# Patient Record
Sex: Male | Born: 1975 | Race: White | Hispanic: No | Marital: Married | Smoking: Never smoker
Health system: Southern US, Community
[De-identification: ages and names within clinical notes are randomized; demographics above are authoritative.]

## PROBLEM LIST (undated history)

## (undated) DIAGNOSIS — G549 Nerve root and plexus disorder, unspecified: Secondary | ICD-10-CM

## (undated) DIAGNOSIS — M5126 Other intervertebral disc displacement, lumbar region: Secondary | ICD-10-CM

## (undated) HISTORY — PX: MICRODISCECTOMY LUMBAR: SUR864

## (undated) HISTORY — PX: WISDOM TOOTH EXTRACTION: SHX21

## (undated) HISTORY — PX: VASECTOMY: SHX75

---

## 2012-08-14 ENCOUNTER — Ambulatory Visit
Admission: RE | Admit: 2012-08-14 | Discharge: 2012-08-14 | Disposition: A | Discharge status: Home / Self Care | Payer: 59 | Source: Ambulatory Visit | Attending: Orthopaedic Surgery | Admitting: Orthopaedic Surgery

## 2012-08-14 ENCOUNTER — Other Ambulatory Visit: Payer: Self-pay | Admitting: Orthopaedic Surgery

## 2012-08-14 DIAGNOSIS — M545 Low back pain, unspecified: Secondary | ICD-10-CM

## 2012-08-14 DIAGNOSIS — R200 Anesthesia of skin: Secondary | ICD-10-CM

## 2012-08-14 DIAGNOSIS — M5410 Radiculopathy, site unspecified: Secondary | ICD-10-CM

## 2012-08-15 ENCOUNTER — Other Ambulatory Visit (HOSPITAL_COMMUNITY): Payer: Self-pay | Admitting: Orthopaedic Surgery

## 2012-08-15 ENCOUNTER — Encounter (HOSPITAL_COMMUNITY): Payer: Self-pay | Admitting: Pharmacy Technician

## 2012-08-15 ENCOUNTER — Other Ambulatory Visit: Payer: Self-pay | Admitting: Orthopaedic Surgery

## 2012-08-16 ENCOUNTER — Encounter (HOSPITAL_COMMUNITY)
Admission: RE | Admit: 2012-08-16 | Discharge: 2012-08-16 | Disposition: A | Discharge status: Home / Self Care | Payer: 59 | Source: Ambulatory Visit | Attending: Orthopaedic Surgery | Admitting: Orthopaedic Surgery

## 2012-08-16 ENCOUNTER — Encounter (HOSPITAL_COMMUNITY): Payer: Self-pay

## 2012-08-16 ENCOUNTER — Other Ambulatory Visit: Payer: Self-pay

## 2012-08-16 ENCOUNTER — Encounter (HOSPITAL_COMMUNITY): Payer: Self-pay | Admitting: Pharmacy Technician

## 2012-08-16 LAB — COMPREHENSIVE METABOLIC PANEL
ALT: 32 U/L (ref 0–53)
AST: 25 U/L (ref 0–37)
Albumin: 3.9 g/dL (ref 3.5–5.2)
CO2: 30 mEq/L (ref 19–32)
Chloride: 103 mEq/L (ref 96–112)
GFR calc non Af Amer: >90 mL/min (ref >90)
Potassium: 4.7 mEq/L (ref 3.5–5.1)
Sodium: 141 mEq/L (ref 135–145)
Total Bilirubin: 0.3 mg/dL (ref 0.3–1.2)

## 2012-08-16 LAB — CBC
Platelets: 264 10*3/uL (ref 150–400)
RBC: 4.99 MIL/uL (ref 4.22–5.81)
RDW: 12.2 % (ref 11.5–15.5)
WBC: 7.3 10*3/uL (ref 4.0–10.5)

## 2012-08-16 LAB — SURGICAL PCR SCREEN
MRSA, PCR: NEGATIVE
Staphylococcus aureus: NEGATIVE

## 2012-08-16 NOTE — Pre-Procedure Instructions (Addendum)
[place]aly Kroeker  08/16/2012    Your procedure is scheduled on:  08/18/12  Report to [facility] at 830AM.  Call this number if you have problems the morning of surgery: [phone]   Remember:   Do not eat food or drink liquids after midnight.   Take these medicines the morning of surgery with A SIP OF WATER:none STOP ibuprofen now   Do not wear jewelry, make-up or nail polish.  Do not wear lotions, powders, or perfumes. You may wear deodorant.  Do not shave 48 hours prior to surgery. Men may shave face and neck.  Do not bring valuables to the hospital.  Contacts, dentures or bridgework may not be worn into surgery.  Leave suitcase in the car. After surgery it may be brought to your room.  For patients admitted to the hospital, checkout time is 11:00 AM the day of  discharge.   Patients discharged the day of surgery will not be allowed to drive  home.  Name and phone number of your driver:   Special Instructions: Shower using CHG 2 nights before surgery and the night before surgery.  If you shower the day of surgery use CHG.  Use special wash - you have one bottle of CHG for all showers.  You should use approximately 1/3 of the bottle for each shower.   Please read over the following fact sheets that you were given: Pain Booklet, Coughing and Deep Breathing and MRSA Information

## 2012-08-17 DIAGNOSIS — M5126 Other intervertebral disc displacement, lumbar region: Secondary | ICD-10-CM | POA: Diagnosis present

## 2012-08-17 MED ORDER — CEFAZOLIN SODIUM-DEXTROSE 2-3 GM-% IV SOLR
2.0000 g | INTRAVENOUS | Status: AC
  Administered 2012-08-18: 2 g via INTRAVENOUS
  Filled 2012-08-17: qty 50

## 2012-08-17 NOTE — H&P (Signed)
Douglas Gardner is an 37 y.o. male.   Chief Complaint: back pain and right leg pain HPI:  problem with his back off and on for greater than 5 years.  In the past, he had some problems with back pain and some back spasms and now has developed radicular symptoms in his right buttocks, right leg, down to the plantar surface of his right foot, particularly with sitting, with associated lateral and plantar foot numbness.  He is better standing.  If he sits for a long time, drives a car, he has increased symptoms.  When he squats or lifts, he has had increased pain.  Denies any associated bowel or bladder symptoms.  He has taken some ibuprofen, which has really not helped. Due to his increasing symptoms and failure of response to Medrol Dosepak, MRI scan was ordered at his request which shows a large disk herniation at L5-S1 with free fragment caudally migrated, paramedian.  It occupies 80% of the canal with nerve root compression severe and lateral recess stenosis.     History reviewed. No pertinent past medical history.  History reviewed. No pertinent past surgical history.  History reviewed. No pertinent family history. Social History:  reports that he has never smoked. He does not have any smokeless tobacco history on file. He reports that he drinks about 8.4 ounces of alcohol per week. He reports that he does not use illicit drugs.  Allergies: No Known Allergies  Medications Prior to Admission  Medication Sig Dispense Refill  . cyclobenzaprine (FLEXERIL) 10 MG tablet Take 10 mg by mouth 3 (three) times daily as needed for muscle spasms.      Marland Kitchen ibuprofen (ADVIL,MOTRIN) 200 MG tablet Take 200 mg by mouth every 6 (six) hours as needed for pain.      . predniSONE (STERAPRED UNI-PAK) 5 MG TABS Take by mouth as directed.         Results for orders placed during the hospital encounter of 08/16/12 (from the past 48 hour(s))  CBC     Status: Abnormal   Collection Time    08/16/12  3:17 PM      Result  Value Range   WBC 7.3  4.0 - 10.5 K/uL   RBC 4.99  4.22 - 5.81 MIL/uL   Hemoglobin 15.5  13.0 - 17.0 g/dL   HCT 16.1  09.6 - 04.5 %   MCV 85.0  78.0 - 100.0 fL   MCH 31.1  26.0 - 34.0 pg   MCHC 36.6 (*) 30.0 - 36.0 g/dL   RDW 40.9  81.1 - 91.4 %   Platelets 264  150 - 400 K/uL  COMPREHENSIVE METABOLIC PANEL     Status: None   Collection Time    08/16/12  3:17 PM      Result Value Range   Sodium 141  135 - 145 mEq/L   Potassium 4.7  3.5 - 5.1 mEq/L   Comment: HEMOLYSIS AT THIS LEVEL MAY AFFECT RESULT   Chloride 103  96 - 112 mEq/L   CO2 30  19 - 32 mEq/L   Glucose, Bld 90  70 - 99 mg/dL   BUN 17  6 - 23 mg/dL   Creatinine, Ser 7.82  0.50 - 1.35 mg/dL   Calcium 9.4  8.4 - 95.6 mg/dL   Total Protein 7.6  6.0 - 8.3 g/dL   Albumin 3.9  3.5 - 5.2 g/dL   AST 25  0 - 37 U/L   ALT 32  0 - 53  U/L   Alkaline Phosphatase 61  39 - 117 U/L   Total Bilirubin 0.3  0.3 - 1.2 mg/dL   GFR calc non Af Amer >90  >90 mL/min   GFR calc Af Amer >90  >90 mL/min   Comment:            The eGFR has been calculated     using the CKD EPI equation.     This calculation has not been     validated in all clinical     situations.     eGFR's persistently     <90 mL/min signify     possible Chronic Kidney Disease.  SURGICAL PCR SCREEN     Status: None   Collection Time    08/16/12  3:18 PM      Result Value Range   MRSA, PCR NEGATIVE  NEGATIVE   Staphylococcus aureus NEGATIVE  NEGATIVE   Comment:            The Xpert SA Assay (FDA     approved for NASAL specimens     in patients over 73 years of age),     is one component of     a comprehensive surveillance     program.  Test performance has     been validated by [employer] for patients greater     than or equal to 72 year old.     It is not intended     to diagnose infection nor to     guide or monitor treatment.   No results found.  Review of Systems  Constitutional: Negative.   HENT: Negative.   Eyes: Negative.   Respiratory:  Negative.   Cardiovascular: Negative.   Gastrointestinal: Negative.   Genitourinary: Negative.   Musculoskeletal:       Leg pain and weakness  Skin: Negative.   Neurological: Negative.   Endo/Heme/Allergies: Negative.   Psychiatric/Behavioral: Negative.     Blood pressure 120/81, temperature 98.5 F (36.9 C), temperature source Oral, resp. rate 20, SpO2 95.00%. Physical Exam  Constitutional: He is oriented to person, place, and time. He appears well-developed and well-nourished.  HENT:  Head: Normocephalic and atraumatic.  Eyes: EOM are normal. Pupils are equal, round, and reactive to light.  Neck: Normal range of motion. Neck supple.  Cardiovascular: Normal rate.   Respiratory: Effort normal.  GI: Soft.  Musculoskeletal:  + SLR right leg.  Cannot heel and toe walk  Neurological: He is alert and oriented to person, place, and time.  Skin: Skin is warm and dry.  Psychiatric: He has a normal mood and affect.     Assessment/Plan HNP right L5-S1 with free fragment  PLAN:  Microdiscectomy right L5-S1 with excision of free fragment  Douglas Gardner C 08/18/2012, 7:20 AM

## 2012-08-18 ENCOUNTER — Ambulatory Visit (HOSPITAL_COMMUNITY): Payer: 59 | Admitting: Anesthesiology

## 2012-08-18 ENCOUNTER — Observation Stay (HOSPITAL_COMMUNITY)
Admission: RE | Admit: 2012-08-18 | Discharge: 2012-08-19 | Disposition: A | Discharge status: Home / Self Care | Payer: 59 | Source: Ambulatory Visit | Attending: Orthopaedic Surgery | Admitting: Orthopaedic Surgery

## 2012-08-18 ENCOUNTER — Encounter (HOSPITAL_COMMUNITY)
Admission: RE | Disposition: A | Discharge status: Home / Self Care | Payer: Self-pay | Source: Ambulatory Visit | Attending: Orthopaedic Surgery

## 2012-08-18 ENCOUNTER — Encounter (HOSPITAL_COMMUNITY): Payer: Self-pay | Admitting: *Deleted

## 2012-08-18 ENCOUNTER — Ambulatory Visit (HOSPITAL_COMMUNITY): Payer: 59

## 2012-08-18 ENCOUNTER — Encounter (HOSPITAL_COMMUNITY): Payer: Self-pay | Admitting: Anesthesiology

## 2012-08-18 DIAGNOSIS — M5126 Other intervertebral disc displacement, lumbar region: Principal | ICD-10-CM | POA: Diagnosis present

## 2012-08-18 DIAGNOSIS — Z01812 Encounter for preprocedural laboratory examination: Secondary | ICD-10-CM | POA: Insufficient documentation

## 2012-08-18 DIAGNOSIS — M5116 Intervertebral disc disorders with radiculopathy, lumbar region: Secondary | ICD-10-CM

## 2012-08-18 HISTORY — DX: Nerve root and plexus disorder, unspecified: G54.9

## 2012-08-18 HISTORY — PX: LUMBAR LAMINECTOMY: SHX95

## 2012-08-18 SURGERY — MICRODISCECTOMY LUMBAR LAMINECTOMY
Anesthesia: General | Site: Back | Wound class: Clean

## 2012-08-18 MED ORDER — OXYCODONE HCL 5 MG PO TABS
5.0000 mg | ORAL_TABLET | Freq: Once | ORAL | Status: AC | PRN
  Administered 2012-08-18: 5 mg via ORAL

## 2012-08-18 MED ORDER — HYDROMORPHONE HCL PF 1 MG/ML IJ SOLN: 0.5000 mg | INTRAMUSCULAR | Status: DC | PRN

## 2012-08-18 MED ORDER — ARTIFICIAL TEARS OP OINT
TOPICAL_OINTMENT | OPHTHALMIC | Status: DC | PRN
  Administered 2012-08-18: 1 via OPHTHALMIC

## 2012-08-18 MED ORDER — FENTANYL CITRATE 0.05 MG/ML IJ SOLN
INTRAMUSCULAR | Status: DC | PRN
  Administered 2012-08-18 (×3): 50 ug via INTRAVENOUS
  Administered 2012-08-18: 100 ug via INTRAVENOUS
  Administered 2012-08-18: 50 ug via INTRAVENOUS

## 2012-08-18 MED ORDER — ACETAMINOPHEN 650 MG RE SUPP: 650.0000 mg | RECTAL | Status: DC | PRN

## 2012-08-18 MED ORDER — HYDROMORPHONE HCL PF 1 MG/ML IJ SOLN: 0.2500 mg | INTRAMUSCULAR | Status: DC | PRN

## 2012-08-18 MED ORDER — HYDROMORPHONE HCL PF 1 MG/ML IJ SOLN
INTRAMUSCULAR | Status: AC
  Filled 2012-08-18: qty 1

## 2012-08-18 MED ORDER — NEOSTIGMINE METHYLSULFATE 1 MG/ML IJ SOLN
INTRAMUSCULAR | Status: DC | PRN
  Administered 2012-08-18: 3 mg via INTRAVENOUS

## 2012-08-18 MED ORDER — METHOCARBAMOL 500 MG PO TABS
500.0000 mg | ORAL_TABLET | Freq: Four times a day (QID) | ORAL | Status: DC | PRN
  Administered 2012-08-19: 500 mg via ORAL
  Filled 2012-08-18: qty 1

## 2012-08-18 MED ORDER — CYCLOBENZAPRINE HCL 10 MG PO TABS: 10.0000 mg | ORAL_TABLET | Freq: Three times a day (TID) | ORAL | Status: DC | PRN

## 2012-08-18 MED ORDER — SODIUM CHLORIDE 0.9 % IJ SOLN: 3.0000 mL | INTRAMUSCULAR | Status: DC | PRN

## 2012-08-18 MED ORDER — LACTATED RINGERS IV SOLN
INTRAVENOUS | Status: DC | PRN
  Administered 2012-08-18 (×2): via INTRAVENOUS

## 2012-08-18 MED ORDER — POTASSIUM CHLORIDE IN NACL 20-0.45 MEQ/L-% IV SOLN
INTRAVENOUS | Status: DC
  Filled 2012-08-18 (×4): qty 1000

## 2012-08-18 MED ORDER — ONDANSETRON HCL 4 MG/2ML IJ SOLN: 4.0000 mg | INTRAMUSCULAR | Status: DC | PRN

## 2012-08-18 MED ORDER — PROMETHAZINE HCL 25 MG/ML IJ SOLN: 6.2500 mg | INTRAMUSCULAR | Status: DC | PRN

## 2012-08-18 MED ORDER — OXYCODONE-ACETAMINOPHEN 5-325 MG PO TABS: 2.0000 | ORAL_TABLET | ORAL | Status: DC | PRN

## 2012-08-18 MED ORDER — ONDANSETRON HCL 4 MG/2ML IJ SOLN
INTRAMUSCULAR | Status: DC | PRN
  Administered 2012-08-18: 4 mg via INTRAVENOUS

## 2012-08-18 MED ORDER — OXYCODONE-ACETAMINOPHEN 5-325 MG PO TABS: 1.0000 | ORAL_TABLET | ORAL | Status: DC | PRN

## 2012-08-18 MED ORDER — SODIUM CHLORIDE 0.9 % IV SOLN: INTRAVENOUS | Status: DC

## 2012-08-18 MED ORDER — ALUM & MAG HYDROXIDE-SIMETH 200-200-20 MG/5ML PO SUSP: 30.0000 mL | Freq: Four times a day (QID) | ORAL | Status: DC | PRN

## 2012-08-18 MED ORDER — ACETAMINOPHEN 325 MG PO TABS
650.0000 mg | ORAL_TABLET | ORAL | Status: DC | PRN
  Administered 2012-08-19: 650 mg via ORAL
  Filled 2012-08-18: qty 2

## 2012-08-18 MED ORDER — IBUPROFEN 600 MG PO TABS
600.0000 mg | ORAL_TABLET | Freq: Three times a day (TID) | ORAL | Status: DC
  Administered 2012-08-18 (×3): 600 mg via ORAL
  Filled 2012-08-18 (×7): qty 1

## 2012-08-18 MED ORDER — PROPOFOL 10 MG/ML IV BOLUS
INTRAVENOUS | Status: DC | PRN
  Administered 2012-08-18: 200 mg via INTRAVENOUS

## 2012-08-18 MED ORDER — SODIUM CHLORIDE 0.9 % IV SOLN: 250.0000 mL | INTRAVENOUS | Status: DC

## 2012-08-18 MED ORDER — LIDOCAINE HCL (CARDIAC) 20 MG/ML IV SOLN
INTRAVENOUS | Status: DC | PRN
  Administered 2012-08-18: 100 mg via INTRAVENOUS

## 2012-08-18 MED ORDER — DEXTROSE 5 % IV SOLN: 500.0000 mg | Freq: Four times a day (QID) | INTRAVENOUS | Status: DC | PRN

## 2012-08-18 MED ORDER — PHENOL 1.4 % MT LIQD: 1.0000 | OROMUCOSAL | Status: DC | PRN

## 2012-08-18 MED ORDER — OXYCODONE HCL 5 MG/5ML PO SOLN: 5.0000 mg | Freq: Once | ORAL | Status: AC | PRN

## 2012-08-18 MED ORDER — SODIUM CHLORIDE 0.9 % IJ SOLN: 3.0000 mL | Freq: Two times a day (BID) | INTRAMUSCULAR | Status: DC

## 2012-08-18 MED ORDER — MIDAZOLAM HCL 5 MG/5ML IJ SOLN
INTRAMUSCULAR | Status: DC | PRN
  Administered 2012-08-18: 2 mg via INTRAVENOUS

## 2012-08-18 MED ORDER — OXYCODONE HCL 5 MG PO TABS
ORAL_TABLET | ORAL | Status: AC
  Filled 2012-08-18: qty 1

## 2012-08-18 MED ORDER — KETOROLAC TROMETHAMINE 30 MG/ML IJ SOLN
INTRAMUSCULAR | Status: DC | PRN
  Administered 2012-08-18 (×2): 15 mg via INTRAVENOUS

## 2012-08-18 MED ORDER — GLYCOPYRROLATE 0.2 MG/ML IJ SOLN
INTRAMUSCULAR | Status: DC | PRN
  Administered 2012-08-18: 0.4 mg via INTRAVENOUS

## 2012-08-18 MED ORDER — BUPIVACAINE HCL (PF) 0.25 % IJ SOLN
INTRAMUSCULAR | Status: AC
  Filled 2012-08-18: qty 30

## 2012-08-18 MED ORDER — ROCURONIUM BROMIDE 100 MG/10ML IV SOLN
INTRAVENOUS | Status: DC | PRN
  Administered 2012-08-18: 50 mg via INTRAVENOUS

## 2012-08-18 MED ORDER — MENTHOL 3 MG MT LOZG: 1.0000 | LOZENGE | OROMUCOSAL | Status: DC | PRN

## 2012-08-18 SURGICAL SUPPLY — 51 items
BENZOIN TINCTURE PRP APPL 2/3 (GAUZE/BANDAGES/DRESSINGS) ×2 IMPLANT
BUR ROUND FLUTED 4 SOFT TCH (BURR) IMPLANT
CLOTH BEACON ORANGE TIMEOUT ST (SAFETY) ×2 IMPLANT
CLSR STERI-STRIP ANTIMIC 1/2X4 (GAUZE/BANDAGES/DRESSINGS) ×2 IMPLANT
CORDS BIPOLAR (ELECTRODE) ×2 IMPLANT
COVER SURGICAL LIGHT HANDLE (MISCELLANEOUS) ×2 IMPLANT
DERMABOND ADVANCED (GAUZE/BANDAGES/DRESSINGS) ×1
DERMABOND ADVANCED .7 DNX12 (GAUZE/BANDAGES/DRESSINGS) ×1 IMPLANT
DRAPE MICROSCOPE LEICA (MISCELLANEOUS) ×2 IMPLANT
DRAPE PROXIMA HALF (DRAPES) ×4 IMPLANT
DRSG EMULSION OIL 3X3 NADH (GAUZE/BANDAGES/DRESSINGS) ×2 IMPLANT
DURAPREP 26ML APPLICATOR (WOUND CARE) ×2 IMPLANT
ELECT REM PT RETURN 9FT ADLT (ELECTROSURGICAL) ×2
ELECTRODE REM PT RTRN 9FT ADLT (ELECTROSURGICAL) ×1 IMPLANT
GLOVE BIOGEL PI IND STRL 7.5 (GLOVE) ×1 IMPLANT
GLOVE BIOGEL PI IND STRL 8 (GLOVE) ×1 IMPLANT
GLOVE BIOGEL PI INDICATOR 7.5 (GLOVE) ×1
GLOVE BIOGEL PI INDICATOR 8 (GLOVE) ×1
GLOVE ECLIPSE 7.0 STRL STRAW (GLOVE) ×2 IMPLANT
GLOVE ORTHO TXT STRL SZ7.5 (GLOVE) ×2 IMPLANT
GOWN PREVENTION PLUS LG XLONG (DISPOSABLE) IMPLANT
GOWN STRL NON-REIN LRG LVL3 (GOWN DISPOSABLE) ×6 IMPLANT
KIT BASIN OR (CUSTOM PROCEDURE TRAY) ×2 IMPLANT
KIT ROOM TURNOVER OR (KITS) ×2 IMPLANT
MANIFOLD NEPTUNE II (INSTRUMENTS) ×2 IMPLANT
NDL SUT .5 MAYO 1.404X.05X (NEEDLE) ×1 IMPLANT
NEEDLE HYPO 25GX1X1/2 BEV (NEEDLE) ×2 IMPLANT
NEEDLE MAYO TAPER (NEEDLE) ×1
NEEDLE SPNL 18GX3.5 QUINCKE PK (NEEDLE) ×2 IMPLANT
NS IRRIG 1000ML POUR BTL (IV SOLUTION) ×2 IMPLANT
PACK LAMINECTOMY ORTHO (CUSTOM PROCEDURE TRAY) ×2 IMPLANT
PAD ARMBOARD 7.5X6 YLW CONV (MISCELLANEOUS) ×4 IMPLANT
PATTIES SURGICAL .5 X.5 (GAUZE/BANDAGES/DRESSINGS) ×2 IMPLANT
PATTIES SURGICAL .75X.75 (GAUZE/BANDAGES/DRESSINGS) IMPLANT
SPONGE GAUZE 4X4 12PLY (GAUZE/BANDAGES/DRESSINGS) ×2 IMPLANT
SPONGE LAP 4X18 X RAY DECT (DISPOSABLE) ×2 IMPLANT
STRIP CLOSURE SKIN 1/2X4 (GAUZE/BANDAGES/DRESSINGS) IMPLANT
SUT VIC AB 0 CT1 27 (SUTURE) ×1
SUT VIC AB 0 CT1 27XBRD ANBCTR (SUTURE) ×1 IMPLANT
SUT VIC AB 1 CT1 27 (SUTURE) ×1
SUT VIC AB 1 CT1 27XBRD ANBCTR (SUTURE) ×1 IMPLANT
SUT VIC AB 2-0 CT1 27 (SUTURE) ×1
SUT VIC AB 2-0 CT1 TAPERPNT 27 (SUTURE) ×1 IMPLANT
SUT VICRYL 0 TIES 12 18 (SUTURE) ×2 IMPLANT
SUT VICRYL 4-0 PS2 18IN ABS (SUTURE) ×2 IMPLANT
SUT VICRYL AB 2 0 TIES (SUTURE) ×2 IMPLANT
SYR 20ML ECCENTRIC (SYRINGE) IMPLANT
SYR CONTROL 10ML LL (SYRINGE) ×2 IMPLANT
TOWEL OR 17X24 6PK STRL BLUE (TOWEL DISPOSABLE) ×2 IMPLANT
TOWEL OR 17X26 10 PK STRL BLUE (TOWEL DISPOSABLE) ×2 IMPLANT
WATER STERILE IRR 1000ML POUR (IV SOLUTION) ×2 IMPLANT

## 2012-08-18 NOTE — Anesthesia Procedure Notes (Signed)
Procedure Name: Intubation Date/Time: 08/18/2012 7:31 AM Performed by: Lovie Chol Pre-anesthesia Checklist: Patient identified, Emergency Drugs available, Suction available, Patient being monitored and Timeout performed Patient Re-evaluated:Patient Re-evaluated prior to inductionOxygen Delivery Method: Circle system utilized Preoxygenation: Pre-oxygenation with 100% oxygen Intubation Type: IV induction Ventilation: Mask ventilation without difficulty Laryngoscope Size: Miller and 2 Grade View: Grade II Tube type: Oral Tube size: 7.5 mm Number of attempts: 1 Airway Equipment and Method: Stylet Placement Confirmation: ETT inserted through vocal cords under direct vision,  positive ETCO2,  CO2 detector and breath sounds checked- equal and bilateral Secured at: 22 cm Tube secured with: Tape Dental Injury: Teeth and Oropharynx as per pre-operative assessment

## 2012-08-18 NOTE — Op Note (Signed)
Test test  Preop diagnosis right L5 S1 HNP with large free fragment  Postop diagnosis: Same  Procedure: Right L5-S1 microdiscectomy and removal of free fragment  Surgeon Annell Greening M.D.  Asst. RNFA  EBL less than 100 cc  Anesthesia GLT  Findings large free fragment migrated caudally and dorsally to the nerve root causing significant compression also in the midline with a pseudo-stenosis from the large free fragment  Operative procedure: After induction general anesthesia patient was placed prone on chest rolls. Careful padding And Bumpers were applied. Back was prepped with DuraPrep timeout procedure was completed her total 4 x-rays were taken during the case and a burr  was used to remove the bone overlying the disc space. This represented some transitional anatomy on the right side with no interlaminar space at the L5-S1 level. Initial x-ray was taken and after some removal bone second x-ray was taken continued use of the bur until nerve root was well visualized and then with Nicholos Johns 4 down the for final fourth x-ray was taken for proper localization. The ligamentum  were removed using the operative microscope and 2 and 3 mm Kerrisons with careful protection of the dura.Marland Kitchen Operative  microscope was used and a small veins in the gutter were coagulated with bipolar cautery. Nerve root was erythematous irritated the patient had inability to toe walk cut significant weakness giving way of his leg with gastroc soleus weakness and S1 numbness. Foraminal was enlarged bone was removed at the level of the pedicle. Disc was evaluated painful for spot pushed into the disc microbe pituitary was used to grab some pieces. Feeling underneath the nerve root down with free fragment should be sitting there was no fragments present. Continued the looking in the axilla the nerve root nondrinker is no fragments. Some additional ligament was removed over the top the nerve root and the small area of abnormal tissue  consistent with this was noted it was gradually teased tapping graft micropituitary was actually sitting dorsally over the top the dura causing compression and the large fragment was removed which was one half by 2 cm x 1.5 cm. Additional fragment was removed. Passes were made with the ball-tip probe and hockey-stick until all fragments were removed. After this the dura was loose passes could easily be made anterior to the dura posterior to the dura the nerve root but good freedom. Some additional passes were made with the up and down micropituitary in the disc space no additional material is present hockey-stick could be swept anterior to the dura and also dorsal dura with Noris compression. Fragment was removed sent for gross gross the pathologic exam only. The space was irrigated operative field was irrigated. The draped microscope was removed standard layer closure with #1 Vicryl the deep fascia 20 subtendinous tissue for Vicryl subcuticular closure Dermabond and a postop dressing. Instrument count needle count was correct. Patient tolerated procedure well had normal motor strength in her care room and good relief of his right lower extremity gastrocsoleus weakness and the S1 distribution numbness.

## 2012-08-18 NOTE — Interval H&P Note (Signed)
History and Physical Interval Note:  08/18/2012 7:21 AM  Douglas Gardner  has presented today for surgery, with the diagnosis of Right L5-S1 HNP  The various methods of treatment have been discussed with the patient and family. After consideration of risks, benefits and other options for treatment, the patient has consented to  Procedure(s): Right L5-S1 MICRODISCECTOMY, Removal free fragment (N/A) as a surgical intervention .  The patient's history has been reviewed, patient examined, no change in status, stable for surgery.  I have reviewed the patient's chart and labs.  Questions were answered to the patient's satisfaction.     [name]

## 2012-08-18 NOTE — Transfer of Care (Signed)
Immediate Anesthesia Transfer of Care Note  Patient: Douglas Gardner  Procedure(s) Performed: Procedure(s): Right L5-S1 MICRODISCECTOMY, Removal free fragment (N/A)  Patient Location: PACU  Anesthesia Type:General  Level of Consciousness: awake, alert , sedated and patient cooperative  Airway & Oxygen Therapy: Patient Spontanous Breathing and Patient connected to nasal cannula oxygen  Post-op Assessment: Report given to PACU RN and Post -op Vital signs reviewed and stable  Post vital signs: Reviewed and stable  Complications: No apparent anesthesia complications

## 2012-08-18 NOTE — Plan of Care (Signed)
Problem: Consults Goal: Diagnosis - Spinal Surgery Microdiscectomy L5-S1     

## 2012-08-18 NOTE — Brief Op Note (Signed)
08/18/2012  9:47 AM  PATIENT:  Douglas Gardner  37 y.o. male  PRE-OPERATIVE DIAGNOSIS:  Right L5-S1 HNP  POST-OPERATIVE DIAGNOSIS:  Right L5-S1 HNP  PROCEDURE:  Procedure(s): Right L5-S1 MICRODISCECTOMY, Removal free fragment (N/A)  SURGEON:  Surgeon(s) and Role:    * Eldred Manges, MD - Primary  PHYSICIAN ASSISTANT:   ASSISTANTS: RNFA   ANESTHESIA:   general  EBL:  Total I/O In: 1600 [I.V.:1600] Out: 50 [Blood:50]  BLOOD ADMINISTERED:none  DRAINS: none   LOCAL MEDICATIONS USED:  NONE  SPECIMEN:  No Specimen  DISPOSITION OF SPECIMEN:  N/A  COUNTS:  YES  TOURNIQUET:  * No tourniquets in log *  DICTATION: .Dragon Dictation  PLAN OF CARE: Admit for overnight observation  PATIENT DISPOSITION:  PACU - hemodynamically stable.   Delay start of Pharmacological VTE agent (>24hrs) due to surgical blood loss or risk of bleeding: not applicable

## 2012-08-18 NOTE — Preoperative (Signed)
Beta Blockers   Reason not to administer Beta Blockers:Not Applicable 

## 2012-08-18 NOTE — Anesthesia Preprocedure Evaluation (Addendum)
Anesthesia Evaluation  Patient identified by MRN, date of birth, ID band Patient awake    Reviewed: Allergy & Precautions, H&P , NPO status , Patient's Chart, lab work & pertinent test results  History of Anesthesia Complications Negative for: history of anesthetic complications  Airway Mallampati: II TM Distance: >3 FB Neck ROM: Full    Dental  (+) Teeth Intact and Dental Advisory Given   Pulmonary neg pulmonary ROS,  breath sounds clear to auscultation  Pulmonary exam normal       Cardiovascular negative cardio ROS  Rhythm:Regular Rate:Normal     Neuro/Psych negative psych ROS   GI/Hepatic negative GI ROS, Neg liver ROS,   Endo/Other  negative endocrine ROS  Renal/GU negative Renal ROS  negative genitourinary   Musculoskeletal   Abdominal   Peds negative pediatric ROS (+)  Hematology negative hematology ROS (+)   Anesthesia Other Findings   Reproductive/Obstetrics negative OB ROS                          Anesthesia Physical Anesthesia Plan  ASA: II  Anesthesia Plan: General   Post-op Pain Management:    Induction: Intravenous  Airway Management Planned: Oral ETT  Additional Equipment:   Intra-op Plan:   Post-operative Plan: Extubation in OR  Informed Consent: I have reviewed the patients History and Physical, chart, labs and discussed the procedure including the risks, benefits and alternatives for the proposed anesthesia with the patient or authorized representative who has indicated his/her understanding and acceptance.   Dental advisory given  Plan Discussed with: CRNA, Anesthesiologist and Surgeon  Anesthesia Plan Comments:         Anesthesia Quick Evaluation

## 2012-08-18 NOTE — Anesthesia Postprocedure Evaluation (Signed)
Anesthesia Post Note  Patient: Douglas Gardner  Procedure(s) Performed: Procedure(s) (LRB): Right L5-S1 MICRODISCECTOMY, Removal free fragment (N/A)  Anesthesia type: general  Patient location: PACU  Post pain: Pain level controlled  Post assessment: Patient's Cardiovascular Status Stable  Last Vitals:  Filed Vitals:   08/18/12 1042  BP: 121/68  Pulse: 72  Temp: 36.3 C  Resp: 16    Post vital signs: Reviewed and stable  Level of consciousness: sedated  Complications: No apparent anesthesia complications

## 2012-08-19 MED ORDER — IBUPROFEN 800 MG PO TABS: 800.0000 mg | ORAL_TABLET | Freq: Three times a day (TID) | ORAL | Status: DC | PRN

## 2012-08-19 MED ORDER — HYDROCODONE-ACETAMINOPHEN 7.5-325 MG PO TABS: 1.0000 | ORAL_TABLET | ORAL | Status: DC | PRN

## 2012-08-19 MED ORDER — CYCLOBENZAPRINE HCL 10 MG PO TABS: 10.0000 mg | ORAL_TABLET | Freq: Three times a day (TID) | ORAL | Status: DC | PRN

## 2012-08-19 NOTE — Progress Notes (Signed)
Orthopedic Tech Progress Note Patient Details:  Douglas Gardner Jul 29, 1975 [id number]  Ortho Devices Type of Ortho Device: Crutches Ortho Device/Splint Interventions: Adjustment   Cammer, Mickie Bail 08/19/2012, 10:37 AM

## 2012-08-19 NOTE — Progress Notes (Signed)
Patient ID: Douglas Gardner, male   DOB: 1975-10-11, 37 y.o.   MRN: [id number] Dressing clean dry and intact. Will have dressing changed before discharge. Patient states he still has some numbness in his leg. Plan for discharge to home today.

## 2012-08-21 ENCOUNTER — Encounter (HOSPITAL_COMMUNITY): Payer: Self-pay | Admitting: Orthopaedic Surgery

## 2012-08-21 NOTE — Discharge Summary (Signed)
Physician Discharge Summary  Patient ID: Douglas Gardner MRN: [id number] DOB/AGE: [birth year] 37 y.o.  Admit date: 08/18/2012 Discharge date: 08/21/2012  Admission Diagnoses:  HNP (herniated nucleus pulposus), lumbar right L5-S1  Discharge Diagnoses:  Principal Problem:   HNP (herniated nucleus pulposus), lumbar Active Problems:   Lumbar disc herniation with radiculopathy   Past Medical History  Diagnosis Date  . Nerve root compression     Surgeries: Procedure(s): Right L5-S1 MICRODISCECTOMY, Removal free fragment on 08/18/2012   Consultants (if any):  none  Discharged Condition: Improved  Hospital Course: Douglas Gardner is an 37 y.o. male who was admitted 08/18/2012 with a diagnosis of HNP (herniated nucleus pulposus), lumbar and went to the operating room on 08/18/2012 and underwent the above named procedures.    He was given perioperative antibiotics:  Anti-infectives   Start     Dose/Rate Route Frequency Ordered Stop   08/18/12 0600  ceFAZolin (ANCEF) IVPB 2 g/50 mL premix     2 g 100 mL/hr over 30 Minutes Intravenous On call to O.R. 08/17/12 1357 08/18/12 0730    Radicular pain relieved in the right lower extremity with neurovascular and motor function normal post op. Wound without drainage.  Pt voiding well and taking a regular diet.  He was given sequential compression devices, early ambulation DVT prophylaxis.  He benefited maximally from the hospital stay and there were no complications.    Recent vital signs:  Filed Vitals:   08/19/12 0553  BP: 110/69  Pulse: 79  Temp: 98.7 F (37.1 C)  Resp: 16    Recent laboratory studies:  Lab Results  Component Value Date   HGB 15.5 08/16/2012   Lab Results  Component Value Date   WBC 7.3 08/16/2012   PLT 264 08/16/2012   No results found for this basename: INR   Lab Results  Component Value Date   NA 141 08/16/2012   K 4.7 08/16/2012   CL 103 08/16/2012   CO2 30 08/16/2012   BUN 17 08/16/2012   CREATININE 0.86  08/16/2012   GLUCOSE 90 08/16/2012    Discharge Medications:     Medication List    STOP taking these medications       predniSONE 5 MG Tabs  Commonly known as:  STERAPRED UNI-PAK      TAKE these medications       cyclobenzaprine 10 MG tablet  Commonly known as:  FLEXERIL  Take 1 tablet (10 mg total) by mouth 3 (three) times daily as needed for muscle spasms.     cyclobenzaprine 10 MG tablet  Commonly known as:  FLEXERIL  Take 10 mg by mouth 3 (three) times daily as needed for muscle spasms.     HYDROcodone-acetaminophen 7.5-325 MG per tablet  Commonly known as:  NORCO  Take 1 tablet by mouth every 4 (four) hours as needed for pain.     ibuprofen 800 MG tablet  Commonly known as:  ADVIL,MOTRIN  Take 1 tablet (800 mg total) by mouth every 8 (eight) hours as needed for pain.     oxyCODONE-acetaminophen 5-325 MG per tablet  Commonly known as:  ROXICET  Take 2 tablets by mouth every 4 (four) hours as needed for pain.        Diagnostic Studies: Dg Lumbar Spine Complete  08/18/2012  **ADDENDUM** CREATED: 08/18/2012 09:09:09  *RADIOLOGY REPORT*  Clinical Data: Microdiskectomy at L5-S1.  LUMBAR SPINE - COMPLETE 4+ VIEW  Technique: Four intraoperative lateral views for localization of lumbar spine.  Comparison:  MRI 08/14/2012.  Findings: This addendum issued because and an additional film was obtained after the original dictation.  Intraoperative localization is again demonstrated at L5-S1 and this is correlated with the disc at L5-S1 on prior MRI.  IMPRESSION: Intraoperative localization at L5-S1.  Additional film necessitates addendum.  **END ADDENDUM** SIGNED BY: Andreas Newport, M.D.   08/18/2012  *RADIOLOGY REPORT*  Clinical Data: Microdiskectomy at L5-S1.  LUMBAR SPINE - COMPLETE 4+ VIEW  Comparison: MRI 08/14/2012.  Findings: Initial image demonstrates localization needle dorsal to the L5 spinous process.  Subsequently, retractors are introduced dorsal to L5.  On the last image,  there is a probe dorsal to S1.  IMPRESSION: Intraoperative localization as above.   Original Report Authenticated By: Andreas Newport, M.D.    Mr Lumbar Spine Wo Contrast  08/14/2012  *RADIOLOGY REPORT*  Clinical Data: Right leg pain and numbness  MRI LUMBAR SPINE WITHOUT CONTRAST  Technique:  Multiplanar and multiecho pulse sequences of the lumbar spine were obtained without intravenous contrast.  Comparison: None.  Findings: There is no significant finding at L3-4 or above.  The discs show normal morphology.  The canal and foramina are widely patent.  The distal cord and conus are normal.  L4-5:  The disc is degenerated and shows shallow broad-based herniation with slight caudal down turning.  There is facet and ligamentous hypertrophy.  There is mild narrowing of the lateral recesses without gross neural compression.  L5 S1:  Large right posterolateral disc herniation with a 1.5 cm fragment in the right lateral recess and migrated caudally.  This would be certain to compress the right S1 nerve root.  IMPRESSION: Very large right posterolateral disc herniation with a 1.5 cm free fragment in the right lateral recess and migrated slightly caudally.  Right S1 nerve root compression seems certain.  Shallow broad-based disc herniation at L4-5 with slight caudal down turning.  Mild narrowing of the lateral recesses without gross neural compression.   Original Report Authenticated By: Paulina Fusi, M.D.     Disposition: 01-Home or Self Care      Discharge Orders   Future Orders Complete By Expires     Call MD / Call 911  As directed     Comments:      If you experience chest pain or shortness of breath, CALL 911 and be transported to the hospital emergency room.  If you develope a fever above 101 F, pus (white drainage) or increased drainage or redness at the wound, or calf pain, call your surgeon's office.    Constipation Prevention  As directed     Comments:      Drink plenty of fluids.  Prune juice may  be helpful.  You may use a stool softener, such as Colace (over the counter) 100 mg twice a day.  Use MiraLax (over the counter) for constipation as needed.    Diet - low sodium heart healthy  As directed     For home use only DME Crutches  As directed     Increase activity slowly as tolerated  As directed      OK to shower  With dressing off. Avoid sitting except to eat meals.   Ambulate daily, slowly work up to 2 miles per day.   OK to lay on back , sides, or on stomach  Follow-up Information   Follow up with YATES,MARK C, MD In 1 week.   Contact information:   [address] [place] [address] [phone]  Signed: Wende Neighbors 08/21/2012, 4:44 PM

## 2013-08-15 ENCOUNTER — Other Ambulatory Visit: Payer: Self-pay | Admitting: Orthopaedic Surgery

## 2013-08-15 DIAGNOSIS — M545 Low back pain, unspecified: Secondary | ICD-10-CM

## 2013-08-17 ENCOUNTER — Ambulatory Visit
Admission: RE | Admit: 2013-08-17 | Discharge: 2013-08-17 | Disposition: A | Discharge status: Home / Self Care | Payer: 59 | Source: Ambulatory Visit | Attending: Orthopaedic Surgery | Admitting: Orthopaedic Surgery

## 2013-08-17 DIAGNOSIS — M545 Low back pain, unspecified: Secondary | ICD-10-CM

## 2013-08-17 MED ORDER — GADOBENATE DIMEGLUMINE 529 MG/ML IV SOLN
20.0000 mL | Freq: Once | INTRAVENOUS | Status: AC | PRN
  Administered 2013-08-17: 20 mL via INTRAVENOUS

## 2015-04-18 ENCOUNTER — Encounter (HOSPITAL_COMMUNITY): Payer: Self-pay | Admitting: Emergency Medicine

## 2015-04-18 ENCOUNTER — Emergency Department (INDEPENDENT_AMBULATORY_CARE_PROVIDER_SITE_OTHER)
Admission: EM | Admit: 2015-04-18 | Discharge: 2015-04-18 | Disposition: A | Discharge status: Home / Self Care | Payer: BLUE CROSS/BLUE SHIELD | Source: Home / Self Care | Attending: Family Medicine | Admitting: Family Medicine

## 2015-04-18 DIAGNOSIS — J0101 Acute recurrent maxillary sinusitis: Secondary | ICD-10-CM | POA: Diagnosis not present

## 2015-04-18 MED ORDER — DOXYCYCLINE HYCLATE 100 MG PO CAPS: 100.0000 mg | ORAL_CAPSULE | Freq: Two times a day (BID) | ORAL | Status: AC

## 2015-04-18 MED ORDER — IPRATROPIUM BROMIDE 0.06 % NA SOLN: 2.0000 | Freq: Four times a day (QID) | NASAL | Status: DC

## 2015-04-18 NOTE — ED Notes (Signed)
Pt here with c/o sinus pressure that started on Monday. Congestion, cough and post nasal drainage noted with greenish phlegm. Taking Mucinex for sx's

## 2015-04-18 NOTE — ED Provider Notes (Signed)
CSN: [id number]     Arrival date & time 04/18/15  1300 History   First MD Initiated Contact with Patient 04/18/15 1315     Chief Complaint  Patient presents with  . Sinusitis   (Consider location/radiation/quality/duration/timing/severity/associated sxs/prior Treatment) Patient is a 39 y.o. male presenting with sinusitis. The history is provided by the patient.  Sinusitis Pain details:    Location:  Maxillary   Quality:  Pressure   Severity:  Mild   Duration:  5 days Progression:  Unchanged Chronicity:  New Context comment:  Recent air travel to [place] also wife similarly sick. Relieved by:  None tried Worsened by:  Nothing tried Ineffective treatments:  None tried Associated symptoms: congestion, cough and rhinorrhea   Associated symptoms: no chills, no fever and no shortness of breath     Past Medical History  Diagnosis Date  . Nerve root compression    Past Surgical History  Procedure Laterality Date  . Microdiscectomy lumbar  2/07/29/2012  . Lumbar laminectomy N/A 08/18/2012    Procedure: Right L5-S1 MICRODISCECTOMY, Removal free fragment;  Surgeon: Eldred Manges, MD;  Location: [facility] OR;  Service: Orthopedics;  Laterality: N/A;   No family history on file. Social History  Substance Use Topics  . Smoking status: Never Smoker   . Smokeless tobacco: Never Used     Comment: 2 glasses wine nitely  . Alcohol Use: 8.4 oz/week    14 Glasses of wine per week     Comment: SOCIAL    Review of Systems  Constitutional: Negative for fever and chills.  HENT: Positive for congestion, postnasal drip and rhinorrhea.   Respiratory: Positive for cough. Negative for shortness of breath.   Cardiovascular: Negative.   All other systems reviewed and are negative.   Allergies  Review of patient's allergies indicates no known allergies.  Home Medications   Prior to Admission medications   Medication Sig Start Date End Date Taking? Authorizing Provider  cyclobenzaprine (FLEXERIL)  10 MG tablet Take 10 mg by mouth 3 (three) times daily as needed for muscle spasms.    Historical Provider, MD  cyclobenzaprine (FLEXERIL) 10 MG tablet Take 1 tablet (10 mg total) by mouth 3 (three) times daily as needed for muscle spasms. 08/19/12   Nadara Mustard, MD  doxycycline (VIBRAMYCIN) 100 MG capsule Take 1 capsule (100 mg total) by mouth 2 (two) times daily. 04/18/15   Linna Hoff, MD  HYDROcodone-acetaminophen (NORCO) 7.5-325 MG per tablet Take 1 tablet by mouth every 4 (four) hours as needed for pain. 08/19/12   Nadara Mustard, MD  ibuprofen (ADVIL,MOTRIN) 800 MG tablet Take 1 tablet (800 mg total) by mouth every 8 (eight) hours as needed for pain. 08/19/12   Nadara Mustard, MD  ipratropium (ATROVENT) 0.06 % nasal spray Place 2 sprays into both nostrils 4 (four) times daily. 04/18/15   Linna Hoff, MD  oxyCODONE-acetaminophen (ROXICET) 5-325 MG per tablet Take 2 tablets by mouth every 4 (four) hours as needed for pain. 08/18/12   Eldred Manges, MD   Meds Ordered and Administered this Visit  Medications - No data to display  BP 111/54 mmHg  Pulse 81  Temp(Src) 98.5 F (36.9 C) (Oral)  SpO2 100% No data found.   Physical Exam  Constitutional: He is oriented to person, place, and time. He appears well-developed and well-nourished. No distress.  HENT:  Right Ear: External ear normal.  Left Ear: External ear normal.  Nose: Mucosal edema and rhinorrhea  present.  Mouth/Throat: Oropharynx is clear and moist.  Neck: Normal range of motion. Neck supple.  Cardiovascular: Normal heart sounds.   Pulmonary/Chest: Effort normal and breath sounds normal.  Lymphadenopathy:    He has no cervical adenopathy.  Neurological: He is alert and oriented to person, place, and time.  Skin: Skin is warm and dry.  Nursing note and vitals reviewed.   ED Course  Procedures (including critical care time)  Labs Review Labs Reviewed - No data to display  Imaging Review No results found.   Visual  Acuity Review  Right Eye Distance:   Left Eye Distance:   Bilateral Distance:    Right Eye Near:   Left Eye Near:    Bilateral Near:         MDM   1. Acute recurrent maxillary sinusitis        Linna HoffJames D Leen Tworek, MD 04/18/15 1335

## 2015-04-18 NOTE — Discharge Instructions (Signed)
Drink plenty of fluids as discussed, use medicine as prescribed, and mucinex or delsym for cough. Return or see your doctor if further problems °

## 2017-03-04 ENCOUNTER — Ambulatory Visit (INDEPENDENT_AMBULATORY_CARE_PROVIDER_SITE_OTHER): Payer: BLUE CROSS/BLUE SHIELD | Admitting: Orthopaedic Surgery

## 2017-03-04 ENCOUNTER — Encounter (INDEPENDENT_AMBULATORY_CARE_PROVIDER_SITE_OTHER): Payer: Self-pay | Admitting: Orthopaedic Surgery

## 2017-03-04 ENCOUNTER — Ambulatory Visit (INDEPENDENT_AMBULATORY_CARE_PROVIDER_SITE_OTHER): Payer: BLUE CROSS/BLUE SHIELD

## 2017-03-04 VITALS — BP 124/83 | HR 94 | Ht 72.0 in | Wt 225.0 lb

## 2017-03-04 DIAGNOSIS — M67431 Ganglion, right wrist: Secondary | ICD-10-CM

## 2017-03-04 DIAGNOSIS — M545 Low back pain: Secondary | ICD-10-CM

## 2017-03-04 DIAGNOSIS — M25531 Pain in right wrist: Secondary | ICD-10-CM

## 2017-03-07 NOTE — Progress Notes (Signed)
Office Visit Note   Patient: Douglas Gardner           Date of Birth: [birth year]           MRN: [id number] Visit Date: 03/04/2017              Requested by: No referring provider defined for this encounter. PCP: System, Pcp Not In   Assessment & Plan: Visit Diagnoses:  1. Acute right-sided low back pain, with sciatica presence unspecified   2. Pain in right wrist     Plan: Patient is a volar wrist ganglion with minimal symptoms. We discussed pathophysiology condition discussed to use this emanating from either the flexor carpi radialis or from the volar wrist joint. If it enlarges and later becomes painful than we can discuss further treatment. Due to his increasing symptoms will obtain an MRI scan with and without contrast lumbar to rule out recurrent disc herniation L5-S1.  Follow-Up Instructions: No Follow-up on file.   Orders:  Orders Placed This Encounter  Procedures  . XR Lumbar Spine 2-3 Views  . XR Wrist 2 Views Right  . MR Lumbar Spine W Wo Contrast   No orders of the defined types were placed in this encounter.     Procedures: No procedures performed   Clinical Data: No additional findings.   Subjective: Chief Complaint  Patient presents with  . Lower Back - Pain    HPI 41 year old male returns with recurrent back and right groin right buttocks pain that radiates down his leg. Since his L5-S1 extremely large free fragment with microdiscectomy 08/18/2012 patient had persistent weakness in his gastroc with inability to jump or run but had been doing well until 3 weeks ago. He denies fever or chills his incision is well-healed.  Patient's also complained of right volar wrist mass adjacent the radial artery without pain. It changes in size and when he is busy doing more manual work on occasion he's noticed some enlargement. Does not particularly painful he states it's in moderate size at this point. No past injury to his right wrist negative for history of  fracture. He did play college baseball. He is right-hand dominant.  Review of Systems  Constitutional: Negative for chills and diaphoresis.  HENT: Negative for ear discharge, ear pain and nosebleeds.   Eyes: Negative for discharge and visual disturbance.  Respiratory: Negative for cough, choking and shortness of breath.   Cardiovascular: Negative for chest pain and palpitations.  Gastrointestinal: Negative for abdominal distention and abdominal pain.  Endocrine: Negative for cold intolerance and heat intolerance.  Genitourinary: Negative for flank pain and hematuria.  Musculoskeletal:       Positive L5-S1 right microdiscectomy 2014 with residual weakness. Right volar wrist mass that changes in size.  Skin: Negative for rash and wound.  Neurological: Negative for seizures and speech difficulty.  Hematological: Negative for adenopathy. Does not bruise/bleed easily.  Psychiatric/Behavioral: Negative for agitation and suicidal ideas.   no hospitalizations other than for the microdiscectomy 2014 is been healthy and active and is not on any medications.   Objective: Vital Signs: BP 124/83   Pulse 94   Ht 6' (1.829 m)   Wt 225 lb (102.1 kg)   BMI 30.52 kg/m   Physical Exam  Constitutional: He is oriented to person, place, and time. He appears well-developed and well-nourished.  HENT:  Head: Normocephalic and atraumatic.  Eyes: Pupils are equal, round, and reactive to light. EOM are normal.  Neck: No tracheal deviation present. No  thyromegaly present.  Cardiovascular: Normal rate.   Pulmonary/Chest: Effort normal. He has no wheezes.  Abdominal: Soft. Bowel sounds are normal.  Musculoskeletal:  Patient has some discomfort with sciatic notch palpation of the right some pain with straight leg raising 80. He is not able to toe walk on the right side but can do toe raises on the left easily. He only has trace gastroc atrophy. S1 decreased sensation and some decreased sensation right L5.  Trace hamstring weakness on the right negative on the left. Anterior tib EHL on the right and left are strong distal pulses and normal tenderness.  Right wrist shows 1.5 x 1 cm mass adjacent the radial artery. Good FCR strength. Normal wrist range of motion. Allen's test at the wrist is normal. No snuffbox tenderness scaphoid tuberosity is normal.  Neurological: He is alert and oriented to person, place, and time.  Skin: Skin is warm and dry. Capillary refill takes less than 2 seconds.  Psychiatric: He has a normal mood and affect. His behavior is normal. Judgment and thought content normal.    Ortho Exam lumbar incision is well-healed L5-S1 on the right. No tenderness no erythema. Absent ankle jerk on the right 2+ on the left 2+ knee jerk. Decreased sensation right L5 and also S1 distribution. No L5 weakness on the right on exam. Gastrocsoleus is weak as is trace peroneal weakness.  Specialty Comments:  No specialty comments available.  Imaging: No results found.   PMFS History: Patient Active Problem List   Diagnosis Date Noted  . 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 HNP (herniated nucleus pulposus), lumbar 08/17/2012    Class: Diagnosis of   Past Medical History:  Diagnosis Date  . Nerve root compression     No family history on file.  Past Surgical History:  Procedure Laterality Date  . LUMBAR LAMINECTOMY N/A 08/18/2012   Procedure: Right L5-S1 MICRODISCECTOMY, Removal free fragment;  Surgeon: Eldred MangesMark C Haneen Bernales, MD;  [facility];  Service: Orthopedics;  Laterality: N/A;  . MICRODISCECTOMY LUMBAR  2/07/29/2012   Social History   Occupational History  . Not on file.   Social History Main Topics  . Smoking status: Never Smoker  . Smokeless tobacco: Never Used     Comment: 2 glasses wine nitely  . Alcohol use 8.4 oz/week    14 Glasses of wine per week     Comment: SOCIAL  . Drug use: No  . Sexual activity: Not on file

## 2017-03-16 ENCOUNTER — Ambulatory Visit
Admission: RE | Admit: 2017-03-16 | Discharge: 2017-03-16 | Disposition: A | Discharge status: Home / Self Care | Payer: BLUE CROSS/BLUE SHIELD | Source: Ambulatory Visit | Attending: Orthopaedic Surgery | Admitting: Orthopaedic Surgery

## 2017-03-16 DIAGNOSIS — M545 Low back pain: Secondary | ICD-10-CM

## 2017-03-16 MED ORDER — GADOBENATE DIMEGLUMINE 529 MG/ML IV SOLN
20.0000 mL | Freq: Once | INTRAVENOUS | Status: AC | PRN
  Administered 2017-03-16: 20 mL via INTRAVENOUS

## 2017-03-18 ENCOUNTER — Ambulatory Visit (INDEPENDENT_AMBULATORY_CARE_PROVIDER_SITE_OTHER): Payer: BLUE CROSS/BLUE SHIELD

## 2017-03-18 DIAGNOSIS — M67431 Ganglion, right wrist: Secondary | ICD-10-CM

## 2017-03-18 DIAGNOSIS — M5441 Lumbago with sciatica, right side: Principal | ICD-10-CM

## 2017-03-18 DIAGNOSIS — G8929 Other chronic pain: Secondary | ICD-10-CM

## 2017-03-18 DIAGNOSIS — M5442 Lumbago with sciatica, left side: Principal | ICD-10-CM

## 2017-03-18 DIAGNOSIS — M25531 Pain in right wrist: Secondary | ICD-10-CM

## 2017-03-19 LAB — CBC WITH DIFFERENTIAL/PLATELET
BASOS ABS: 32 {cells}/uL (ref 0–200)
Basophils Relative: 0.7 %
EOS ABS: 143 {cells}/uL (ref 15–500)
Eosinophils Relative: 3.1 %
HEMATOCRIT: 43 % (ref 38.5–50.0)
Hemoglobin: 14.7 g/dL (ref 13.2–17.1)
LYMPHS ABS: 1357 {cells}/uL (ref 850–3900)
MCH: 30.8 pg (ref 27.0–33.0)
MCHC: 34.2 g/dL (ref 32.0–36.0)
MCV: 90 fL (ref 80.0–100.0)
MPV: 9.3 fL (ref 7.5–12.5)
Monocytes Relative: 9.2 %
NEUTROS PCT: 57.5 %
Neutro Abs: 2645 cells/uL (ref 1500–7800)
PLATELETS: 240 10*3/uL (ref 140–400)
RBC: 4.78 10*6/uL (ref 4.20–5.80)
RDW: 12.1 % (ref 11.0–15.0)
TOTAL LYMPHOCYTE: 29.5 %
WBC: 4.6 10*3/uL (ref 3.8–10.8)
WBCMIX: 423 {cells}/uL (ref 200–950)

## 2017-03-19 LAB — SEDIMENTATION RATE: Sed Rate: 2 mm/h (ref 0–15)

## 2017-03-19 LAB — C-REACTIVE PROTEIN: CRP: 5.2 mg/L (ref <8.0)

## 2018-03-14 ENCOUNTER — Encounter (INDEPENDENT_AMBULATORY_CARE_PROVIDER_SITE_OTHER): Payer: Self-pay | Admitting: Orthopaedic Surgery

## 2018-03-14 ENCOUNTER — Ambulatory Visit (INDEPENDENT_AMBULATORY_CARE_PROVIDER_SITE_OTHER): Payer: BLUE CROSS/BLUE SHIELD | Admitting: Orthopaedic Surgery

## 2018-03-14 VITALS — BP 142/76 | HR 81 | Ht 72.0 in | Wt 225.0 lb

## 2018-03-14 DIAGNOSIS — M5126 Other intervertebral disc displacement, lumbar region: Secondary | ICD-10-CM | POA: Diagnosis not present

## 2018-03-14 DIAGNOSIS — M5116 Intervertebral disc disorders with radiculopathy, lumbar region: Secondary | ICD-10-CM | POA: Diagnosis not present

## 2018-03-14 MED ORDER — HYDROCODONE-ACETAMINOPHEN 5-325 MG PO TABS: 1.0000 | ORAL_TABLET | Freq: Four times a day (QID) | ORAL | 0 refills | Status: DC | PRN

## 2018-03-14 NOTE — Progress Notes (Signed)
Office Visit Note   Patient: Douglas Gardner           Date of Birth: 1975-10-09           MRN: [id number] Visit Date: 03/14/2018              Requested by: No referring provider defined for this encounter. PCP: Patient, No Pcp Per   Assessment & Plan: Visit Diagnoses:  1. HNP (herniated nucleus pulposus), lumbar   2. Lumbar disc herniation with radiculopathy     Plan: Patient has lumbar degenerative disc with previous microdiscectomy 2014.  MRI plain radiographs of shown some retrolisthesis at L5-S1 with recurrent disc protrusion and severe right foraminal stenosis.  He is not able to stand, legs giving way.  Norco prescribed for pain will obtain an MRI with and without contrast lumbar spine and see him back after the scan.  We have had previous discussions concerning lumbar fusion for his progressive disc degeneration severe foraminal stenosis and recurrent disc herniation.  Office follow-up after scan.  Follow-Up Instructions: No follow-ups on file.   Orders:  No orders of the defined types were placed in this encounter.  No orders of the defined types were placed in this encounter.     Procedures: No procedures performed   Clinical Data: No additional findings.   Subjective: Chief Complaint  Patient presents with  . Lower Back - Pain    HPI 42 year old male seen with recurrent back and right leg pain since Sunday and is not able to stand up.  He can make it 15 to 20 seconds in upright position and then has to lay back down.  He was started on Medrol Dosepak Sunday and is gotten a little bit of relief with this.  He also has ibuprofen and has taken some Flexeril at night.  He is tried to avoid narcotics but now is requesting something for pain.  Great difficulty using the toilet since he is not able to tolerate sitting.  He is most comfortable in the spine position with his knees flexed.  Previous microdiscectomy for removal of large free fragment 08/18/2012 L5-S1 right.   He has had residual calf weakness after the surgery is not been able to do single stance toe raises.  He had been working until last week when pain got severe and is not able to work.  MRI scan 03/16/2017 showed some progression of mild disc degeneration facet arthropathy and mild narrowing at L4-5.  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  No fever or chills he denies left leg symptoms.  No neck pain no upper extremity numbness or tingling.  Review of Systems 14 point review of systems updated unchanged from 03/04/2017 office visit other than as mentioned above in HPI.   Objective: Vital Signs: BP (!) 142/76   Pulse 81   Ht 6' (1.829 m)   Wt 225 lb (102.1 kg)   BMI 30.52 kg/m   Physical Exam  Constitutional: He is oriented to person, place, and time. He appears well-developed and well-nourished.  In moderate to severe pain  HENT:  Head: Normocephalic and atraumatic.  Eyes: Pupils are equal, round, and reactive to light. EOM are normal.  Neck: No tracheal deviation present. No thyromegaly present.  Cardiovascular: Normal rate.  Pulmonary/Chest: Effort normal. He has no wheezes.  Abdominal: Soft. Bowel sounds are normal.  Neurological: He is alert and oriented to person, place, and time.  Skin:  Skin is warm and dry. Capillary refill takes less than 2 seconds.  Psychiatric: He has a normal mood and affect. His behavior is normal. Judgment and thought content normal.    Ortho Exam positive straight leg raising on the right 40 degrees.  Weakness gastrocsoleus in the right negative on the left there is right gastrocsoleus atrophy.  Positive popliteal compression test.  Lumbar incision is well-healed.  No pain with hip range of motion.  Lumbar incision shows no erythema.  Anterior tib EHL is strong.  Specialty Comments:  No specialty comments available.  Imaging: ADDENDUM REPORT: 03/16/2017 16:55  ADDENDUM: The original  report was by Dr. Gaylyn RongWalter Liebkemann. The following addendum is by Dr. Gaylyn RongWalter Liebkemann:  These results were called by telephone at the time of interpretation on 03/16/2017 at 4:40 pm to Dr. Annell GreeningMARK Samiha Denapoli , who verbally acknowledged these results.   Electronically Signed   By: Gaylyn RongWalter  Liebkemann M.D.   On: 03/16/2017 16:55   Addended by Herbie BaltimoreLiebkemann, Walt, MD on 03/16/2017 4:57 PM    Study Result   CLINICAL DATA:  Acute right buttock pain radiating to the right leg and foot. Prior lumbar surgery in 2014  EXAM: MRI LUMBAR SPINE WITHOUT AND WITH CONTRAST  TECHNIQUE: Multiplanar and multiecho pulse sequences of the lumbar spine were obtained without and with intravenous contrast.  CONTRAST:  20mL MULTIHANCE GADOBENATE DIMEGLUMINE 529 MG/ML IV SOLN  COMPARISON:  Multiple exams, including 08/17/2013  FINDINGS: Segmentation: The lowest lumbar type non-rib-bearing vertebra is labeled as L5.  Alignment:  No vertebral subluxation is observed.  Vertebrae: Type 2 degenerative endplate findings at L5-S1. Multilevel Schmorl 's nodes in the upper lumbar spine. Disc desiccation at L4-5 and L5-S1.  Conus medullaris: Extends to the L1 level and appears normal.  Paraspinal and other soft tissues: 2.3 cm left renal cyst, no abnormal enhancement in the visualized portion.  Disc levels:  L1-2:  Unremarkable.  L2- 3:  Unremarkable.  L3-4:  No impingement.  Mild disc bulge.  L4-5: Mild bilateral subarticular lateral recess stenosis, mild central narrowing of the thecal sac, and mild right foraminal stenosis due to facet arthropathy, disc bulge, and central disc protrusion. The central narrowing of the thecal sac and right foraminal stenosis are slightly worsened compared to 08/17/13.  L5-S1: Severe right and borderline left subarticular lateral recess stenosis and at least moderate right eccentric central narrowing of the thecal sac along with moderate right and mild  left foraminal stenosis due to intervertebral spurring, facet arthropathy, disc bulge, and an abnormal right lateral recess process extending from the disc level down 3.2 cm in the right side of the sacral spinal canal. This abnormal process has an irregular 2.2 by 0.4 by 0.4 cm central hypoenhancing low signal portion potentially representing disc fragment and/or epidural hematoma; and a peripheral rind of enhancing tissue likely representing surrounding inflammatory response. The total lesions size including the surrounding enhancing tissue is 3.2 by 1.3 by 0.9 cm, with significant impingement on the right S1 and right S2 nerve roots as well as effacement of the right side of the thecal sac in the sacral canal. This is a new finding compared to prior. There is evidence of prior right laminectomy at the L5 level.  IMPRESSION: 1. Prominent right eccentric impingement at and just below L5-S1 as detailed above, primarily due to an unusual epidural process extending caudad from the L5-S1 intervertebral disc in the right sacral canal. This process has an irregular nonenhancing low signal center, and a thick surrounding  rind of enhancement. This central portion probably represents either extruded disc material/disc fragment or epidural hematoma, and the surrounding enhancing portion likely represents inflammatory reaction. The combined lesion causes prominent mass effect on the right S1 and S2 nerve roots, and also effaces the right side of the sacral thecal sac. Given the overall configuration I am skeptical of a neoplastic process as a cause for this enhancing lesion. I am also skeptical of epidural abscess given the configuration and localized appearance, although clearly if the patient has otherwise unexplained fever or leukocytosis then the possibility of infection must be at least considered. I reviewed the appearance with Dr. Davonna Belling, who concurs. 2. Spondylosis and  degenerative disc disease at the L5-S1 level also cause moderate right and mild left foraminal stenosis. 3. Mild and slightly progressive impingement at L4-5 due to spondylosis and degenerative disc disease. Radiology assistant personnel have been notified to put me in telephone contact with the referring physician or the referring physician's clinical representative in order to discuss these findings. Once this communication is established I will issue an addendum to this report for documentation purposes.  Electronically Signed: By: Gaylyn Rong M.D. On: 03/16/2017 16:25         PMFS History: Patient Active Problem List   Diagnosis Date Noted  . 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 HNP (herniated nucleus pulposus), lumbar 08/17/2012    Class: Diagnosis of   Past Medical History:  Diagnosis Date  . Nerve root compression     No family history on file.  Past Surgical History:  Procedure Laterality Date  . LUMBAR LAMINECTOMY N/A 08/18/2012   Procedure: Right L5-S1 MICRODISCECTOMY, Removal free fragment;  Surgeon: Eldred Manges, MD;  Location: [facility] OR;  Service: Orthopedics;  Laterality: N/A;  . MICRODISCECTOMY LUMBAR  2/07/29/2012   Social History   Occupational History  . Not on file  Tobacco Use  . Smoking status: Never Smoker  . Smokeless tobacco: Never Used  . Tobacco comment: 2 glasses wine nitely  Substance and Sexual Activity  . Alcohol use: Yes    Alcohol/week: 14.0 standard drinks    Types: 14 Glasses of wine per week    Comment: SOCIAL  . Drug use: No  . Sexual activity: Not on file

## 2018-03-15 ENCOUNTER — Ambulatory Visit
Admission: RE | Admit: 2018-03-15 | Discharge: 2018-03-15 | Disposition: A | Discharge status: Home / Self Care | Payer: BLUE CROSS/BLUE SHIELD | Source: Ambulatory Visit | Attending: Orthopaedic Surgery | Admitting: Orthopaedic Surgery

## 2018-03-15 ENCOUNTER — Telehealth (INDEPENDENT_AMBULATORY_CARE_PROVIDER_SITE_OTHER): Payer: Self-pay | Admitting: Orthopaedic Surgery

## 2018-03-15 DIAGNOSIS — M5116 Intervertebral disc disorders with radiculopathy, lumbar region: Secondary | ICD-10-CM

## 2018-03-15 DIAGNOSIS — M5126 Other intervertebral disc displacement, lumbar region: Secondary | ICD-10-CM

## 2018-03-15 MED ORDER — GADOBENATE DIMEGLUMINE 529 MG/ML IV SOLN
20.0000 mL | Freq: Once | INTRAVENOUS | Status: AC | PRN
  Administered 2018-03-15: 20 mL via INTRAVENOUS

## 2018-03-15 NOTE — Telephone Encounter (Signed)
Patient's wife Majel HomerBett called advised patient is having the MRI today at 1:30pm. Patient is scheduled for Friday at 3:45. Bett asked if patient can get in sooner to see Dr Ophelia CharterYates to review the MRI. The number to contact Bett is [phone]

## 2018-03-15 NOTE — Telephone Encounter (Signed)
Fyi.. Results are available in the system.

## 2018-03-17 ENCOUNTER — Other Ambulatory Visit: Payer: Self-pay

## 2018-03-17 ENCOUNTER — Encounter (HOSPITAL_COMMUNITY): Payer: Self-pay | Admitting: *Deleted

## 2018-03-17 ENCOUNTER — Ambulatory Visit (INDEPENDENT_AMBULATORY_CARE_PROVIDER_SITE_OTHER): Payer: BLUE CROSS/BLUE SHIELD | Admitting: Orthopaedic Surgery

## 2018-03-17 ENCOUNTER — Telehealth (INDEPENDENT_AMBULATORY_CARE_PROVIDER_SITE_OTHER): Payer: Self-pay | Admitting: Orthopaedic Surgery

## 2018-03-17 ENCOUNTER — Encounter (INDEPENDENT_AMBULATORY_CARE_PROVIDER_SITE_OTHER): Payer: Self-pay | Admitting: Orthopaedic Surgery

## 2018-03-17 VITALS — BP 146/80 | HR 70 | Ht 72.0 in | Wt 225.0 lb

## 2018-03-17 DIAGNOSIS — M5126 Other intervertebral disc displacement, lumbar region: Secondary | ICD-10-CM | POA: Diagnosis not present

## 2018-03-17 DIAGNOSIS — M5116 Intervertebral disc disorders with radiculopathy, lumbar region: Secondary | ICD-10-CM | POA: Diagnosis not present

## 2018-03-17 MED ORDER — HYDROCODONE-ACETAMINOPHEN 5-325 MG PO TABS: 1.0000 | ORAL_TABLET | Freq: Four times a day (QID) | ORAL | 0 refills | Status: DC | PRN

## 2018-03-17 NOTE — Telephone Encounter (Signed)
Patient's wife voiced concern over not having enough pain medication to get him through the weekend. Please call or advise. Patient's surgery is Monday, Sept 23rd @ 3:00. Pre-admission has asked him to arive at 12:30pm, so he does not have to go do labs today.

## 2018-03-17 NOTE — Progress Notes (Signed)
      Office Visit Note/ preop H and P              Patient: Douglas Gardner                                             Date of Birth: 04/18/1976                                                    MRN: [id number] Visit Date: 03/17/2018                                                                     Requested by: No referring provider defined for this encounter. PCP: Patient, No Pcp Per   Assessment & Plan: Visit Diagnoses:  1. HNP (herniated nucleus pulposus), lumbar   2. Lumbar disc herniation with radiculopathy           Recurrent right L5-S1 disc herniation   Plan: Patient not able to stand cannot walk he cannot sit in a chair.  We discussed options including single level fusion versus repeat microdiscectomy.  Since his surgery in 2014 he had been working ,doing well until 2 weeks ago.  Patient requests repeating with repeat microdiscectomy right L5-S1 MRI documenting recurrent disc herniation and displacement of the right S1 nerve root.  Currently he is not able to walk, stand or sit in a chair.  He is been flat on his back for more than a week.  Plan post op would be overnight stay.  Risks of surgery discussed including infection repeat disc herniation, possibility of need for lumbar fusion.  Questions were elicited and answered he understands and requests we proceed.  Patient's father and wife are present today and included in the discussion.  Decision for surgery was made today.  Norco prescription refilled.  Follow-Up Instructions: No follow-ups on file.   Orders:  No orders of the defined types were placed in this encounter.      Meds ordered this encounter  Medications  . HYDROcodone-acetaminophen (NORCO/VICODIN) 5-325 MG tablet    Sig: Take 1-2 tablets by mouth every 6 (six) hours as needed for moderate pain.    Dispense:  30 tablet    Refill:  0      Procedures: No procedures performed   Clinical Data: No additional  findings.   Subjective: No chief complaint on file.   HPI 42-year-old male returns post MRI scan with and without contrast lumbar spine for recurrent right L5-S1 HNP with radiculopathy.  Patient can only stand for 2 to 3 minutes.  He is not able to sit in the chair.  He has had problems with constipation is been taking stool softener, hydrocodone, Medrol Dosepak without relief.  Previous microdiscectomy right L5-S1 on 08/18/2012 with removal of large free fragment.  He had residual weakness in his calf muscle had an MRI after the surgical procedure that showed some scar tissue, some residual disc material.  His pain   symptoms have been severe and is not been able to work he is only comfortable lying down with left hip flexed and right hip and figure 4 position with his heel on his knee.  Has to have assistance to make it to the bathroom.  Pain is severe with Valsalva.  No opposite left leg symptoms. He has been taking ibuprofen, prednisone pack, Flexeril at night, hydrocodone for pain.  Patient requests refill for hydrocodone. Review of Systems  Constitutional: Negative.   HENT: Negative.   Eyes: Negative.   Respiratory: Negative.   Cardiovascular: Negative.   Gastrointestinal: Negative.   Endocrine: Negative.   Genitourinary: Negative.   Musculoskeletal: Positive for back pain and gait problem.       Right L5 microdiscectomy 2014.  Neurological: Positive for weakness and numbness.       Weakness numbness right S1 distribution with recurrent HNP L5-S1.  Hematological: Negative.   Psychiatric/Behavioral: Negative.      Objective: Vital Signs: BP (!) 146/80   Pulse 70   Ht 6' (1.829 m)   Wt 225 lb (102.1 kg)   BMI 30.52 kg/m   Physical Exam  Constitutional: He is oriented to person, place, and time. He appears well-developed and well-nourished.  HENT:  Head: Normocephalic and atraumatic.  Eyes: Pupils are equal, round, and reactive to light. EOM are normal.  Neck: No  tracheal deviation present. No thyromegaly present.  Cardiovascular: Normal rate.  Pulmonary/Chest: Effort normal. He has no wheezes.  Abdominal: Soft. Bowel sounds are normal.  Neurological: He is alert and oriented to person, place, and time.  Skin: Skin is warm and dry. Capillary refill takes less than 2 seconds.  Psychiatric: He has a normal mood and affect. His behavior is normal. Judgment and thought content normal.    Ortho Exam positive straight leg raising 40 degrees on the right patient has gastrocsoleus weakness and has great difficulty standing he cannot toe raise on the right.  Manual gastrocsoleus weakness with resisted testing.  Negative contralateral straight leg raising.  Absent ankle jerk on the right peroneal is weak EHL anterior tib is strong.  Specialty Comments:  No specialty comments available.  Imaging:CLINICAL DATA: Back pain and prior lumbar spine surgery  EXAM: MRI LUMBAR SPINE WITHOUT AND WITH CONTRAST  TECHNIQUE: Multiplanar and multiecho pulse sequences of the lumbar spine were obtained without and with intravenous contrast.  CONTRAST: 20mL MULTIHANCE GADOBENATE DIMEGLUMINE 529 MG/ML IV SOLN  COMPARISON: Lumbar spine MRI 03/16/2017  FINDINGS: Segmentation: Normal  Alignment: Normal  Vertebrae: Hemangioma at the inferior anterior aspect of the L5 vertebral body is unchanged. No acute compression fracture. No discitis-osteomyelitis.  Conus medullaris and cauda equina: Conus extends to the the L1 level. Conus and cauda equina appear normal.  Paraspinal and other soft tissues: There is a 2.3 cm left renal cyst, unchanged.  Disc levels:  T11-12: Normal.  T12-L1: Normal.  L1-L2: Normal.  L2-L3: Normal.  L3-L4: Normal.  L4-L5: There is a small central disc protrusion with annular fissure, unchanged in size. No spinal canal or neural foraminal stenosis.  L5-S1: Remote right L5 hemilaminectomy. There is again  seen right subarticular material that narrows the right lateral recess and displaces the descending right S1 nerve root. This material now shows more homogeneous contrast enhancement than on the previous study. There is a component of residual disc extrusion with inferior migration, nonenhancing, which also contributes to the mass effect on the right S1 nerve root. There is mild bilateral neural foraminal stenosis due to   uncovertebral spurring. There is no central spinal canal stenosis.  IMPRESSION: 1. Persistence of right subarticular material at the L5-S1 level that narrows the right lateral recess and displaces the descending right S1 nerve root. The contrast enhancement pattern is now more homogeneous and this is most consistent with scar/granulation tissue. There is likely also a component of residual disc extrusion in the right lateral recess, corresponding to the nonenhancing component. 2. Unchanged L4-5 small central disc protrusion and annular fissure.   Electronically Signed By: Kevin Herman M.D. On: 03/15/2018 14:46     PMFS History:      Patient Active Problem List   Diagnosis Date Noted  . 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 HNP (herniated nucleus pulposus), lumbar 08/17/2012    Class: Diagnosis of       Past Medical History:  Diagnosis Date  . Nerve root compression     No family history on file.       Past Surgical History:  Procedure Laterality Date  . LUMBAR LAMINECTOMY N/A 08/18/2012   Procedure: Right L5-S1 MICRODISCECTOMY, Removal free fragment;  Surgeon: Mark C Yates, MD;  Location: [facility] OR;  Service: Orthopedics;  Laterality: N/A;  . MICRODISCECTOMY LUMBAR  2/07/29/2012   Social History        Occupational History  . Not on file  Tobacco Use  . Smoking status: Never Smoker  . Smokeless tobacco: Never Used  . Tobacco comment: 2 glasses wine nitely  Substance and Sexual Activity  . Alcohol use: Yes     Alcohol/week: 14.0 standard drinks    Types: 14 Glasses of wine per week    Comment: SOCIAL  . Drug use: No  . Sexual activity: Not on file         

## 2018-03-17 NOTE — Telephone Encounter (Signed)
Pain medication refilled per Dr. Ophelia CharterYates. Wife was advised ok to pick up.

## 2018-03-17 NOTE — Progress Notes (Signed)
Pt denies SOB, chest pain, and being under the care of a cardiologist. Pt denies having a stress test, echo and cardiac cath. Pt denies having an EKG and chest x ray within the last year. Pt denies recent labs. Pt made aware to stop taking Aspirin, vitamins, fish oil, CBD oil and herbal medications. Do not take any NSAIDs ie: Ibuprofen, Advil, Naproxen (Aleve), Motrin, BC and Goody Powder. Pt verbalized understanding of all pre-op instructions.

## 2018-03-20 ENCOUNTER — Ambulatory Visit (HOSPITAL_COMMUNITY): Payer: BLUE CROSS/BLUE SHIELD

## 2018-03-20 ENCOUNTER — Observation Stay (HOSPITAL_COMMUNITY)
Admission: RE | Admit: 2018-03-20 | Discharge: 2018-03-21 | Disposition: A | Discharge status: Home / Self Care | Payer: BLUE CROSS/BLUE SHIELD | Source: Ambulatory Visit | Attending: Orthopaedic Surgery | Admitting: Orthopaedic Surgery

## 2018-03-20 ENCOUNTER — Ambulatory Visit (HOSPITAL_COMMUNITY): Payer: BLUE CROSS/BLUE SHIELD | Admitting: Anesthesiology

## 2018-03-20 ENCOUNTER — Encounter (HOSPITAL_COMMUNITY)
Admission: RE | Disposition: A | Discharge status: Home / Self Care | Payer: Self-pay | Source: Ambulatory Visit | Attending: Orthopaedic Surgery

## 2018-03-20 ENCOUNTER — Other Ambulatory Visit: Payer: Self-pay

## 2018-03-20 ENCOUNTER — Encounter (HOSPITAL_COMMUNITY): Payer: Self-pay

## 2018-03-20 DIAGNOSIS — Z9889 Other specified postprocedural states: Secondary | ICD-10-CM | POA: Insufficient documentation

## 2018-03-20 DIAGNOSIS — M5126 Other intervertebral disc displacement, lumbar region: Secondary | ICD-10-CM | POA: Diagnosis present

## 2018-03-20 DIAGNOSIS — Z419 Encounter for procedure for purposes other than remedying health state, unspecified: Secondary | ICD-10-CM

## 2018-03-20 DIAGNOSIS — M5117 Intervertebral disc disorders with radiculopathy, lumbosacral region: Principal | ICD-10-CM | POA: Insufficient documentation

## 2018-03-20 DIAGNOSIS — M5116 Intervertebral disc disorders with radiculopathy, lumbar region: Secondary | ICD-10-CM | POA: Diagnosis present

## 2018-03-20 HISTORY — PX: LUMBAR LAMINECTOMY/DECOMPRESSION MICRODISCECTOMY: SHX5026

## 2018-03-20 HISTORY — DX: Other intervertebral disc displacement, lumbar region: M51.26

## 2018-03-20 LAB — CBC
HEMATOCRIT: 45.4 % (ref 39.0–52.0)
Hemoglobin: 15.9 g/dL (ref 13.0–17.0)
MCH: 31 pg (ref 26.0–34.0)
MCHC: 35 g/dL (ref 30.0–36.0)
MCV: 88.5 fL (ref 78.0–100.0)
Platelets: 227 10*3/uL (ref 150–400)
RBC: 5.13 MIL/uL (ref 4.22–5.81)
RDW: 11.3 % — AB (ref 11.5–15.5)
WBC: 5.2 10*3/uL (ref 4.0–10.5)

## 2018-03-20 LAB — COMPREHENSIVE METABOLIC PANEL
ALT: 41 U/L (ref 0–44)
AST: 23 U/L (ref 15–41)
Albumin: 4 g/dL (ref 3.5–5.0)
Alkaline Phosphatase: 56 U/L (ref 38–126)
Anion gap: 9 (ref 5–15)
BILIRUBIN TOTAL: 0.9 mg/dL (ref 0.3–1.2)
BUN: 17 mg/dL (ref 6–20)
CALCIUM: 8.8 mg/dL — AB (ref 8.9–10.3)
CO2: 24 mmol/L (ref 22–32)
CREATININE: 0.87 mg/dL (ref 0.61–1.24)
Chloride: 103 mmol/L (ref 98–111)
GFR calc non Af Amer: >60 mL/min (ref >60)
Glucose, Bld: 96 mg/dL (ref 70–99)
Potassium: 3.8 mmol/L (ref 3.5–5.1)
Sodium: 136 mmol/L (ref 135–145)
TOTAL PROTEIN: 7.4 g/dL (ref 6.5–8.1)

## 2018-03-20 LAB — SURGICAL PCR SCREEN
MRSA, PCR: NEGATIVE
Staphylococcus aureus: NEGATIVE

## 2018-03-20 SURGERY — LUMBAR LAMINECTOMY/DECOMPRESSION MICRODISCECTOMY
Anesthesia: General | Site: Spine Lumbar

## 2018-03-20 MED ORDER — CHLORHEXIDINE GLUCONATE 4 % EX LIQD: 60.0000 mL | Freq: Once | CUTANEOUS | Status: DC

## 2018-03-20 MED ORDER — BUPIVACAINE HCL (PF) 0.25 % IJ SOLN
INTRAMUSCULAR | Status: AC
  Filled 2018-03-20: qty 30

## 2018-03-20 MED ORDER — SODIUM CHLORIDE 0.9 % IV SOLN: INTRAVENOUS | Status: DC

## 2018-03-20 MED ORDER — HYDROMORPHONE HCL 1 MG/ML IJ SOLN: 0.2500 mg | INTRAMUSCULAR | Status: DC | PRN

## 2018-03-20 MED ORDER — MENTHOL 3 MG MT LOZG
1.0000 | LOZENGE | OROMUCOSAL | Status: DC | PRN
  Administered 2018-03-20: 3 mg via ORAL
  Filled 2018-03-20: qty 9

## 2018-03-20 MED ORDER — SUGAMMADEX SODIUM 200 MG/2ML IV SOLN
INTRAVENOUS | Status: DC | PRN
  Administered 2018-03-20: 200 mg via INTRAVENOUS

## 2018-03-20 MED ORDER — ONDANSETRON HCL 4 MG/2ML IJ SOLN
INTRAMUSCULAR | Status: AC
  Filled 2018-03-20: qty 2

## 2018-03-20 MED ORDER — ONDANSETRON HCL 4 MG/2ML IJ SOLN: 4.0000 mg | Freq: Four times a day (QID) | INTRAMUSCULAR | Status: DC | PRN

## 2018-03-20 MED ORDER — FENTANYL CITRATE (PF) 250 MCG/5ML IJ SOLN
INTRAMUSCULAR | Status: AC
  Filled 2018-03-20: qty 5

## 2018-03-20 MED ORDER — DOCUSATE SODIUM 100 MG PO CAPS
100.0000 mg | ORAL_CAPSULE | Freq: Two times a day (BID) | ORAL | Status: DC
  Administered 2018-03-20: 100 mg via ORAL
  Filled 2018-03-20: qty 1

## 2018-03-20 MED ORDER — METHOCARBAMOL 500 MG PO TABS
500.0000 mg | ORAL_TABLET | Freq: Four times a day (QID) | ORAL | Status: DC | PRN
  Administered 2018-03-20 – 2018-03-21 (×2): 500 mg via ORAL
  Filled 2018-03-20 (×2): qty 1

## 2018-03-20 MED ORDER — ROCURONIUM BROMIDE 100 MG/10ML IV SOLN
INTRAVENOUS | Status: DC | PRN
  Administered 2018-03-20: 50 mg via INTRAVENOUS

## 2018-03-20 MED ORDER — ONDANSETRON HCL 4 MG/2ML IJ SOLN
INTRAMUSCULAR | Status: DC | PRN
  Administered 2018-03-20: 4 mg via INTRAVENOUS

## 2018-03-20 MED ORDER — SODIUM CHLORIDE 0.9% FLUSH: 3.0000 mL | INTRAVENOUS | Status: DC | PRN

## 2018-03-20 MED ORDER — LIDOCAINE HCL (CARDIAC) PF 100 MG/5ML IV SOSY
PREFILLED_SYRINGE | INTRAVENOUS | Status: DC | PRN
  Administered 2018-03-20: 100 mg via INTRAVENOUS

## 2018-03-20 MED ORDER — CEFAZOLIN SODIUM-DEXTROSE 2-4 GM/100ML-% IV SOLN
INTRAVENOUS | Status: AC
  Filled 2018-03-20: qty 100

## 2018-03-20 MED ORDER — ONDANSETRON HCL 4 MG PO TABS: 4.0000 mg | ORAL_TABLET | Freq: Four times a day (QID) | ORAL | Status: DC | PRN

## 2018-03-20 MED ORDER — MIDAZOLAM HCL 2 MG/2ML IJ SOLN
INTRAMUSCULAR | Status: DC | PRN
  Administered 2018-03-20: 2 mg via INTRAVENOUS

## 2018-03-20 MED ORDER — 0.9 % SODIUM CHLORIDE (POUR BTL) OPTIME
TOPICAL | Status: DC | PRN
  Administered 2018-03-20: 1000 mL

## 2018-03-20 MED ORDER — PROPOFOL 10 MG/ML IV BOLUS
INTRAVENOUS | Status: DC | PRN
  Administered 2018-03-20: 160 mg via INTRAVENOUS

## 2018-03-20 MED ORDER — DEXAMETHASONE SODIUM PHOSPHATE 10 MG/ML IJ SOLN
INTRAMUSCULAR | Status: AC
  Filled 2018-03-20: qty 1

## 2018-03-20 MED ORDER — FENTANYL CITRATE (PF) 250 MCG/5ML IJ SOLN
INTRAMUSCULAR | Status: DC | PRN
  Administered 2018-03-20: 150 ug via INTRAVENOUS
  Administered 2018-03-20 (×7): 50 ug via INTRAVENOUS

## 2018-03-20 MED ORDER — BUPIVACAINE HCL (PF) 0.25 % IJ SOLN
INTRAMUSCULAR | Status: DC | PRN
  Administered 2018-03-20: 10 mL

## 2018-03-20 MED ORDER — HYDROMORPHONE HCL 1 MG/ML IJ SOLN: 0.5000 mg | INTRAMUSCULAR | Status: DC | PRN

## 2018-03-20 MED ORDER — SODIUM CHLORIDE 0.9% FLUSH
3.0000 mL | Freq: Two times a day (BID) | INTRAVENOUS | Status: DC
  Administered 2018-03-20: 3 mL via INTRAVENOUS

## 2018-03-20 MED ORDER — METHOCARBAMOL 1000 MG/10ML IJ SOLN
500.0000 mg | Freq: Four times a day (QID) | INTRAVENOUS | Status: DC | PRN
  Filled 2018-03-20: qty 5

## 2018-03-20 MED ORDER — DEXAMETHASONE SODIUM PHOSPHATE 10 MG/ML IJ SOLN
INTRAMUSCULAR | Status: DC | PRN
  Administered 2018-03-20: 10 mg via INTRAVENOUS

## 2018-03-20 MED ORDER — OXYCODONE HCL 5 MG PO TABS
5.0000 mg | ORAL_TABLET | ORAL | Status: DC | PRN
  Administered 2018-03-20 – 2018-03-21 (×3): 5 mg via ORAL
  Filled 2018-03-20 (×4): qty 1

## 2018-03-20 MED ORDER — ACETAMINOPHEN 650 MG RE SUPP: 650.0000 mg | RECTAL | Status: DC | PRN

## 2018-03-20 MED ORDER — PHENOL 1.4 % MT LIQD: 1.0000 | OROMUCOSAL | Status: DC | PRN

## 2018-03-20 MED ORDER — SODIUM CHLORIDE 0.9 % IV SOLN: 250.0000 mL | INTRAVENOUS | Status: DC

## 2018-03-20 MED ORDER — ACETAMINOPHEN 325 MG PO TABS: 650.0000 mg | ORAL_TABLET | ORAL | Status: DC | PRN

## 2018-03-20 MED ORDER — POLYETHYLENE GLYCOL 3350 17 G PO PACK: 17.0000 g | PACK | Freq: Every day | ORAL | Status: DC | PRN

## 2018-03-20 MED ORDER — CEFAZOLIN SODIUM-DEXTROSE 2-4 GM/100ML-% IV SOLN
2.0000 g | INTRAVENOUS | Status: AC
  Administered 2018-03-20: 2 g via INTRAVENOUS

## 2018-03-20 MED ORDER — CEFAZOLIN SODIUM-DEXTROSE 2-4 GM/100ML-% IV SOLN
2.0000 g | Freq: Three times a day (TID) | INTRAVENOUS | Status: AC
  Administered 2018-03-20 – 2018-03-21 (×2): 2 g via INTRAVENOUS
  Filled 2018-03-20 (×2): qty 100

## 2018-03-20 MED ORDER — MIDAZOLAM HCL 2 MG/2ML IJ SOLN
INTRAMUSCULAR | Status: AC
  Filled 2018-03-20: qty 2

## 2018-03-20 MED ORDER — LACTATED RINGERS IV SOLN
INTRAVENOUS | Status: DC
  Administered 2018-03-20 (×2): via INTRAVENOUS

## 2018-03-20 SURGICAL SUPPLY — 43 items
BUR ROUND FLUTED 4 SOFT TCH (BURR) IMPLANT
BUR ROUND FLUTED 4MM SOFT TCH (BURR)
CANISTER SUCT 3000ML (MISCELLANEOUS) ×3 IMPLANT
CANISTER SUCT 3000ML PPV (MISCELLANEOUS) ×3 IMPLANT
CLOSURE STERI-STRIP 1/2X4 (GAUZE/BANDAGES/DRESSINGS) ×1
CLSR STERI-STRIP ANTIMIC 1/2X4 (GAUZE/BANDAGES/DRESSINGS) ×2 IMPLANT
COVER SURGICAL LIGHT HANDLE (MISCELLANEOUS) ×3 IMPLANT
DECANTER SPIKE VIAL GLASS SM (MISCELLANEOUS) ×3 IMPLANT
DRAPE HALF SHEET 40X57 (DRAPES) ×6 IMPLANT
DRAPE MICROSCOPE LEICA (MISCELLANEOUS) ×3 IMPLANT
DRAPE SURG 17X23 STRL (DRAPES) ×3 IMPLANT
DRSG MEPILEX BORDER 4X4 (GAUZE/BANDAGES/DRESSINGS) ×3 IMPLANT
DURAPREP 26ML APPLICATOR (WOUND CARE) ×3 IMPLANT
ELECT REM PT RETURN 9FT ADLT (ELECTROSURGICAL) ×3
ELECTRODE REM PT RTRN 9FT ADLT (ELECTROSURGICAL) ×1 IMPLANT
GLOVE BIOGEL PI IND STRL 8 (GLOVE) ×2 IMPLANT
GLOVE BIOGEL PI INDICATOR 8 (GLOVE) ×4
GLOVE ORTHO TXT STRL SZ7.5 (GLOVE) ×6 IMPLANT
GOWN STRL REUS W/ TWL LRG LVL3 (GOWN DISPOSABLE) ×2 IMPLANT
GOWN STRL REUS W/ TWL XL LVL3 (GOWN DISPOSABLE) ×1 IMPLANT
GOWN STRL REUS W/TWL 2XL LVL3 (GOWN DISPOSABLE) ×3 IMPLANT
GOWN STRL REUS W/TWL LRG LVL3 (GOWN DISPOSABLE) ×4
GOWN STRL REUS W/TWL XL LVL3 (GOWN DISPOSABLE) ×2
KIT BASIN OR (CUSTOM PROCEDURE TRAY) ×3 IMPLANT
KIT TURNOVER KIT B (KITS) ×3 IMPLANT
MANIFOLD NEPTUNE II (INSTRUMENTS) IMPLANT
NEEDLE HYPO 25GX1X1/2 BEV (NEEDLE) ×3 IMPLANT
NEEDLE SPNL 18GX3.5 QUINCKE PK (NEEDLE) ×3 IMPLANT
NS IRRIG 1000ML POUR BTL (IV SOLUTION) ×3 IMPLANT
PACK LAMINECTOMY ORTHO (CUSTOM PROCEDURE TRAY) ×3 IMPLANT
PAD ARMBOARD 7.5X6 YLW CONV (MISCELLANEOUS) ×6 IMPLANT
PATTIES SURGICAL .5 X.5 (GAUZE/BANDAGES/DRESSINGS) IMPLANT
PATTIES SURGICAL .75X.75 (GAUZE/BANDAGES/DRESSINGS) IMPLANT
SPONGE LAP 4X18 RFD (DISPOSABLE) ×6 IMPLANT
SUT VIC AB 0 CT1 27 (SUTURE)
SUT VIC AB 0 CT1 27XBRD ANBCTR (SUTURE) IMPLANT
SUT VIC AB 1 CTX 36 (SUTURE) ×2
SUT VIC AB 1 CTX36XBRD ANBCTR (SUTURE) ×1 IMPLANT
SUT VIC AB 2-0 CT1 27 (SUTURE) ×2
SUT VIC AB 2-0 CT1 TAPERPNT 27 (SUTURE) ×1 IMPLANT
SUT VIC AB 3-0 X1 27 (SUTURE) ×3 IMPLANT
TOWEL OR 17X24 6PK STRL BLUE (TOWEL DISPOSABLE) ×3 IMPLANT
TOWEL OR 17X26 10 PK STRL BLUE (TOWEL DISPOSABLE) ×3 IMPLANT

## 2018-03-20 NOTE — Interval H&P Note (Signed)
History and Physical Interval Note:  03/20/2018 12:49 PM  [place] Corine ShelterJ Weigelt  has presented today for surgery, with the diagnosis of recurrent right L5-S1 Herniated Nucleus Pulposus  The various methods of treatment have been discussed with the patient and family. After consideration of risks, benefits and other options for treatment, the patient has consented to  Procedure(s): RIGHT L5-S1  MICRODISCECTOMY FOR RECURRENT HERNIATED NUCLEUS PULPOSUS (N/A) as a surgical intervention .  The patient's history has been reviewed, patient examined, no change in status, stable for surgery.  I have reviewed the patient's chart and labs.  Questions were answered to the patient's satisfaction.     Eldred MangesMark C Han Lysne

## 2018-03-20 NOTE — Anesthesia Postprocedure Evaluation (Signed)
Anesthesia Post Note  Patient: Douglas Gardner  Procedure(s) Performed: RIGHT REDO L5-S1  MICRODISCECTOMY FOR RECURRENT HERNIATED NUCLEUS PULPOSUS (N/A Spine Lumbar)     Patient location during evaluation: PACU Anesthesia Type: General Level of consciousness: awake Pain management: pain level controlled Vital Signs Assessment: post-procedure vital signs reviewed and stable Respiratory status: spontaneous breathing Cardiovascular status: stable Postop Assessment: no apparent nausea or vomiting Anesthetic complications: no    Last Vitals:  Vitals:   03/20/18 1254 03/20/18 1807  BP: (!) 144/81 129/78  Pulse: 73 96  Resp: 20 (P) 10  Temp: 36.7 C (P) 36.6 C  SpO2: 100% 97%    Last Pain:  Vitals:   03/20/18 1318  TempSrc:   PainSc: 5                  Kolbee Stallman

## 2018-03-20 NOTE — Op Note (Signed)
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  Surgeon: Annell GreeningMark Rhian Funari, MD  Assistant: Zonia KiefJames Owens, PA-C medically necessary and present for the entire procedure.  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  He said to 3-week history of severe back pain leg pain progressive leg weakness inability to stand or sit and difficulty going to the bathroom with recurrent H&P on the right at L5-S1 with lumbar MRI with and without contrast.  There was nerve root displacement and paracentral disc protrusion with collapse of the disc.  He also had some L4-5 annular tear with minimal central bulge unchanged from previous MRI.  Procedure after induction general anesthesia patient placed prone on chest rolls careful padding and positioning.  Back was prepped with DuraPrep warmer was applied calf pumpers 1010 drape at S2 level transfers DuraPrep area squared with towels sterile skin marker on the old incision with cross lines Betadine Steri-Drape and laminectomy sheets and drapes.  Timeout procedure was completed.  Ancef 2 g was given prophylactically.  Old incision was opened extended 5 to 7 mm proximal distal subperiosteal dissection after initial spinal needle x-ray was taken which was just below the L5-S1 level.  Subperiosteal dissection under the lamina was performed which was difficult to identify due to thick scar tissue.  Spinous process actually angled to the right side and a curved which with a self-retaining retractor pushed exposure laterally towards the facet joint.  Lamina was thinned after operative microscope was draped brought in.  Repeat x-rays  were taken to confirm localization with a Penfield #4 the interlaminar space.  There was a narrow gap between the L5 and S1 lamina.  Portion of the facet joint was trimmed back and using the operative microscope 2 and 3 mm Kerrison was used to trim back bone until the disc was exposed.  Nerve root was retracted toward the midline there was large disc bulge present and some epidural veins were coagulated with the bipolar cautery.  Additional bone was removed cephalad as well little bit lateral at the level of the pedicle for exposure.  Nerve root on the right side was displaced dorsally and was gently retracted annulus was incised and disc bulge was removed.  There was minimal epidural scar tissue present but disc bulge was prominent and underneath the annulus portions of disc are pushed toward the midline and chunks of disc that were degenerative removed with a straight pituitary and up-biting pituitaries.  Epstein curette was used on the right side laterally and a little bit of bone was removed in line with the disc space to open up the foramina for the L5 nerve root.  Palpation with Woodson anterior to the dura was performed pushing the fragments down using the Epstein curettes and continued decompression until the disc was flat at the midline.  Bone was removed at the level of the pedicle at S1 and the nerve root was free performing a foraminotomy around the edge of the pedicle to make sure there were no fragments anterior to the nerve root no fragments at the midline.  Wax was used on bone fragments copious irrigation.  Bipolar cautery extra long was used due to the patient's muscular back and extra long 2 and 3  mm Kerrison Aundria Rud.  Fascia was closed with #1 Vicryl interrupted and figure-of-eight sutures 2-0 Vicryl subtenons tissue skin staple closure Adaptic 4 x 4's and tape was applied for postoperative dressing patient tolerated the procedure well with significant relief of this back and right leg pain postop  in the recovery room.  Marcaine was infiltrated in the skin 10 cc.

## 2018-03-20 NOTE — Transfer of Care (Signed)
Immediate Anesthesia Transfer of Care Note  Patient: Douglas Gardner  Procedure(s) Performed: RIGHT REDO L5-S1  MICRODISCECTOMY FOR RECURRENT HERNIATED NUCLEUS PULPOSUS (N/A Spine Lumbar)  Patient Location: PACU  Anesthesia Type:General  Level of Consciousness: awake, alert , oriented and patient cooperative  Airway & Oxygen Therapy: Patient Spontanous Breathing  Post-op Assessment: Report given to RN and Post -op Vital signs reviewed and stable  Post vital signs: Reviewed and stable  Last Vitals:  Vitals Value Taken Time  BP 129/78 03/20/2018  6:07 PM  Temp    Pulse 103 03/20/2018  6:09 PM  Resp 10 03/20/2018  6:09 PM  SpO2 98 % 03/20/2018  6:09 PM  Vitals shown include unvalidated device data.  Last Pain:  Vitals:   03/20/18 1318  TempSrc:   PainSc: 5       Patients Stated Pain Goal: 3 (03/20/18 1318)  Complications: No apparent anesthesia complications

## 2018-03-20 NOTE — H&P (Signed)
Office Visit Note/ preop H and P              Patient: Douglas Gardner                                             Date of Birth: 11/07/1975                                                    MRN: [id number] Visit Date: 03/17/2018                                                                     Requested by: No referring provider defined for this encounter. PCP: Patient, No Pcp Per   Assessment & Plan: Visit Diagnoses:  1. HNP (herniated nucleus pulposus), lumbar   2. Lumbar disc herniation with radiculopathy           Recurrent right L5-S1 disc herniation   Plan: Patient not able to stand cannot walk he cannot sit in a chair.  We discussed options including single level fusion versus repeat microdiscectomy.  Since his surgery in 2014 he had been working ,doing well until 2 weeks ago.  Patient requests repeating with repeat microdiscectomy right L5-S1 MRI documenting recurrent disc herniation and displacement of the right S1 nerve root.  Currently he is not able to walk, stand or sit in a chair.  He is been flat on his back for more than a week.  Plan post op would be overnight stay.  Risks of surgery discussed including infection repeat disc herniation, possibility of need for lumbar fusion.  Questions were elicited and answered he understands and requests we proceed.  Patient's father and wife are present today and included in the discussion.  Decision for surgery was made today.  Norco prescription refilled.  Follow-Up Instructions: No follow-ups on file.   Orders:  No orders of the defined types were placed in this encounter.      Meds ordered this encounter  Medications  . HYDROcodone-acetaminophen (NORCO/VICODIN) 5-325 MG tablet    Sig: Take 1-2 tablets by mouth every 6 (six) hours as needed for moderate pain.    Dispense:  30 tablet    Refill:  0      Procedures: No procedures performed   Clinical Data: No additional  findings.   Subjective: No chief complaint on file.   HPI 42 year old male returns post MRI scan with and without contrast lumbar spine for recurrent right L5-S1 HNP with radiculopathy.  Patient can only stand for 2 to 3 minutes.  He is not able to sit in the chair.  He has had problems with constipation is been taking stool softener, hydrocodone, Medrol Dosepak without relief.  Previous microdiscectomy right L5-S1 on 08/18/2012 with removal of large free fragment.  He had residual weakness in his calf muscle had an MRI after the surgical procedure that showed some scar tissue, some residual disc material.  His pain  symptoms have been severe and is not been able to work he is only comfortable lying down with left hip flexed and right hip and figure 4 position with his heel on his knee.  Has to have assistance to make it to the bathroom.  Pain is severe with Valsalva.  No opposite left leg symptoms. He has been taking ibuprofen, prednisone pack, Flexeril at night, hydrocodone for pain.  Patient requests refill for hydrocodone. Review of Systems  Constitutional: Negative.   HENT: Negative.   Eyes: Negative.   Respiratory: Negative.   Cardiovascular: Negative.   Gastrointestinal: Negative.   Endocrine: Negative.   Genitourinary: Negative.   Musculoskeletal: Positive for back pain and gait problem.       Right L5 microdiscectomy 2014.  Neurological: Positive for weakness and numbness.       Weakness numbness right S1 distribution with recurrent HNP L5-S1.  Hematological: Negative.   Psychiatric/Behavioral: Negative.      Objective: Vital Signs: BP (!) 146/80   Pulse 70   Ht 6' (1.829 m)   Wt 225 lb (102.1 kg)   BMI 30.52 kg/m   Physical Exam  Constitutional: He is oriented to person, place, and time. He appears well-developed and well-nourished.  HENT:  Head: Normocephalic and atraumatic.  Eyes: Pupils are equal, round, and reactive to light. EOM are normal.  Neck: No  tracheal deviation present. No thyromegaly present.  Cardiovascular: Normal rate.  Pulmonary/Chest: Effort normal. He has no wheezes.  Abdominal: Soft. Bowel sounds are normal.  Neurological: He is alert and oriented to person, place, and time.  Skin: Skin is warm and dry. Capillary refill takes less than 2 seconds.  Psychiatric: He has a normal mood and affect. His behavior is normal. Judgment and thought content normal.    Ortho Exam positive straight leg raising 40 degrees on the right patient has gastrocsoleus weakness and has great difficulty standing he cannot toe raise on the right.  Manual gastrocsoleus weakness with resisted testing.  Negative contralateral straight leg raising.  Absent ankle jerk on the right peroneal is weak EHL anterior tib is strong.  Specialty Comments:  No specialty comments available.  Imaging:CLINICAL DATA: Back pain and prior lumbar spine surgery  EXAM: MRI LUMBAR SPINE WITHOUT AND WITH CONTRAST  TECHNIQUE: Multiplanar and multiecho pulse sequences of the lumbar spine were obtained without and with intravenous contrast.  CONTRAST: 20mL MULTIHANCE GADOBENATE DIMEGLUMINE 529 MG/ML IV SOLN  COMPARISON: Lumbar spine MRI 03/16/2017  FINDINGS: Segmentation: Normal  Alignment: Normal  Vertebrae: Hemangioma at the inferior anterior aspect of the L5 vertebral body is unchanged. No acute compression fracture. No discitis-osteomyelitis.  Conus medullaris and cauda equina: Conus extends to the the L1 level. Conus and cauda equina appear normal.  Paraspinal and other soft tissues: There is a 2.3 cm left renal cyst, unchanged.  Disc levels:  T11-12: Normal.  T12-L1: Normal.  L1-L2: Normal.  L2-L3: Normal.  L3-L4: Normal.  L4-L5: There is a small central disc protrusion with annular fissure, unchanged in size. No spinal canal or neural foraminal stenosis.  L5-S1: Remote right L5 hemilaminectomy. There is again  seen right subarticular material that narrows the right lateral recess and displaces the descending right S1 nerve root. This material now shows more homogeneous contrast enhancement than on the previous study. There is a component of residual disc extrusion with inferior migration, nonenhancing, which also contributes to the mass effect on the right S1 nerve root. There is mild bilateral neural foraminal stenosis due to  uncovertebral spurring. There is no central spinal canal stenosis.  IMPRESSION: 1. Persistence of right subarticular material at the L5-S1 level that narrows the right lateral recess and displaces the descending right S1 nerve root. The contrast enhancement pattern is now more homogeneous and this is most consistent with scar/granulation tissue. There is likely also a component of residual disc extrusion in the right lateral recess, corresponding to the nonenhancing component. 2. Unchanged L4-5 small central disc protrusion and annular fissure.   Electronically Signed By: Deatra RobinsonKevin Herman M.D. On: 03/15/2018 14:46     PMFS History:      Patient Active Problem List   Diagnosis Date Noted  . 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 HNP (herniated nucleus pulposus), lumbar 08/17/2012    Class: Diagnosis of       Past Medical History:  Diagnosis Date  . Nerve root compression     No family history on file.       Past Surgical History:  Procedure Laterality Date  . LUMBAR LAMINECTOMY N/A 08/18/2012   Procedure: Right L5-S1 MICRODISCECTOMY, Removal free fragment;  Surgeon: Eldred MangesMark C Yates, MD;  [facility];  Service: Orthopedics;  Laterality: N/A;  . MICRODISCECTOMY LUMBAR  2/07/29/2012   Social History        Occupational History  . Not on file  Tobacco Use  . Smoking status: Never Smoker  . Smokeless tobacco: Never Used  . Tobacco comment: 2 glasses wine nitely  Substance and Sexual Activity  . Alcohol use: Yes     Alcohol/week: 14.0 standard drinks    Types: 14 Glasses of wine per week    Comment: SOCIAL  . Drug use: No  . Sexual activity: Not on file

## 2018-03-20 NOTE — Anesthesia Preprocedure Evaluation (Signed)
Anesthesia Evaluation  Patient identified by MRN, date of birth, ID band Patient awake    Reviewed: Allergy & Precautions, NPO status , Patient's Chart, lab work & pertinent test results  Airway Mallampati: II  TM Distance: >3 FB     Dental   Pulmonary neg pulmonary ROS,    breath sounds clear to auscultation       Cardiovascular negative cardio ROS   Rhythm:Regular Rate:Normal     Neuro/Psych  Neuromuscular disease    GI/Hepatic negative GI ROS, Neg liver ROS,   Endo/Other  negative endocrine ROS  Renal/GU negative Renal ROS     Musculoskeletal   Abdominal   Peds  Hematology   Anesthesia Other Findings   Reproductive/Obstetrics                             Anesthesia Physical Anesthesia Plan  ASA: II  Anesthesia Plan: General   Post-op Pain Management:    Induction: Intravenous  PONV Risk Score and Plan: Treatment may vary due to age or medical condition, Ondansetron and Dexamethasone  Airway Management Planned: Oral ETT  Additional Equipment:   Intra-op Plan:   Post-operative Plan: Extubation in OR  Informed Consent: I have reviewed the patients History and Physical, chart, labs and discussed the procedure including the risks, benefits and alternatives for the proposed anesthesia with the patient or authorized representative who has indicated his/her understanding and acceptance.   Dental advisory given  Plan Discussed with: Anesthesiologist and CRNA  Anesthesia Plan Comments:         Anesthesia Quick Evaluation

## 2018-03-20 NOTE — Plan of Care (Signed)
  Problem: Safety: Goal: Ability to remain free from injury will improve Outcome: Progressing   

## 2018-03-20 NOTE — Anesthesia Procedure Notes (Signed)
Procedure Name: Intubation Date/Time: 03/20/2018 3:25 PM Performed by: White, Amedeo Plenty, CRNA Pre-anesthesia Checklist: Patient identified, Emergency Drugs available, Suction available and Patient being monitored Patient Re-evaluated:Patient Re-evaluated prior to induction Oxygen Delivery Method: Circle System Utilized Preoxygenation: Pre-oxygenation with 100% oxygen Induction Type: IV induction Ventilation: Mask ventilation without difficulty Laryngoscope Size: Mac and 4 Grade View: Grade II Tube type: Oral Tube size: 7.5 mm Number of attempts: 1 Airway Equipment and Method: Stylet and Oral airway Placement Confirmation: ETT inserted through vocal cords under direct vision,  positive ETCO2 and breath sounds checked- equal and bilateral Secured at: 22 cm Tube secured with: Tape Dental Injury: Teeth and Oropharynx as per pre-operative assessment

## 2018-03-21 ENCOUNTER — Encounter (HOSPITAL_COMMUNITY): Payer: Self-pay | Admitting: Orthopaedic Surgery

## 2018-03-21 DIAGNOSIS — M5117 Intervertebral disc disorders with radiculopathy, lumbosacral region: Secondary | ICD-10-CM | POA: Diagnosis not present

## 2018-03-21 MED ORDER — METHOCARBAMOL 500 MG PO TABS: 500.0000 mg | ORAL_TABLET | Freq: Four times a day (QID) | ORAL | 0 refills | Status: AC | PRN

## 2018-03-21 MED ORDER — OXYCODONE-ACETAMINOPHEN 5-325 MG PO TABS: 1.0000 | ORAL_TABLET | ORAL | 0 refills | Status: AC | PRN

## 2018-03-21 NOTE — Progress Notes (Signed)
Pt doing well. Pt and wife given D/C instructions with Rx's, verbal understanding was provided. Pt's dressing was changed per MD order. Pt's IV was removed prior to D/C. Pt D/C'd home via wheelchair @ [phone] per MD order. Pt is stable @ D/C and has no other needs at this time. Rema FendtAshley Zlatan Hornback, RN

## 2018-03-21 NOTE — Progress Notes (Signed)
   Subjective: 1 Day Post-Op Procedure(s) (LRB): RIGHT REDO L5-S1  MICRODISCECTOMY FOR RECURRENT HERNIATED NUCLEUS PULPOSUS (N/A) Patient reports pain as mild.    Objective: Vital signs in last 24 hours: Temp:  [97.7 F (36.5 C)-98.5 F (36.9 C)] 98.3 F (36.8 C) (09/24 0405) Pulse Rate:  [72-102] 94 (09/24 0405) Resp:  [5-20] 20 (09/24 0405) BP: (107-144)/(58-86) 113/77 (09/24 0405) SpO2:  [96 %-100 %] 96 % (09/24 0405) Weight:  [99.8 kg] 99.8 kg (09/23 1254)  Intake/Output from previous day: 09/23 0701 - 09/24 0700 In: 1503 [I.V.:1503] Out: 300 [Blood:300] Intake/Output this shift: No intake/output data recorded.  Recent Labs    03/20/18 1336  HGB 15.9   Recent Labs    03/20/18 1336  WBC 5.2  RBC 5.13  HCT 45.4  PLT 227   Recent Labs    03/20/18 1336  NA 136  K 3.8  CL 103  CO2 24  BUN 17  CREATININE 0.87  GLUCOSE 96  CALCIUM 8.8*   No results for input(s): LABPT, INR in the last 72 hours.  calf strength right takes good resistance. ambulatory Dg Lumbar Spine 2-3 Views  Result Date: 03/20/2018 CLINICAL DATA:  Intraoperative localization EXAM: LUMBAR SPINE - 2-3 VIEW COMPARISON:  03/15/2018 FINDINGS: Three lateral images of the lumbar spine were obtained intraoperatively. The initial image demonstrates a needle within the posterior soft tissues just below the L5-S1 interspace. Subsequent images demonstrate surgical instruments and retractors at the L5-S1 interspace. IMPRESSION: Intraoperative localization at L5-S1 Electronically Signed   By: Alcide CleverMark  Lukens M.D.   On: 03/20/2018 16:23   Dg Lumbar Spine 1 View  Result Date: 03/20/2018 CLINICAL DATA:  L5-S1 discectomy. EXAM: LUMBAR SPINE - 1 VIEW COMPARISON:  03/20/2018 FINDINGS: Examination demonstrates normal vertebral body alignment and heights. There is mild spondylosis present to include facet arthropathy. Minimal disc space narrowing at the L4-5 and L5-S1 levels. Surgical instrument is present with tip  over the posterior elements at the L5-S1 level. IMPRESSION: Surgical instrument with tip over the posterior elements at the L5-S1 level. Electronically Signed   By: Elberta Fortisaniel  Boyle M.D.   On: 03/20/2018 17:03    Assessment/Plan: 1 Day Post-Op Procedure(s) (LRB): RIGHT REDO L5-S1  MICRODISCECTOMY FOR RECURRENT HERNIATED NUCLEUS PULPOSUS (N/A) Plan: discharge home office one week.   Eldred MangesMark C Henson Fraticelli 03/21/2018, 7:17 AM

## 2018-03-21 NOTE — Discharge Instructions (Signed)
Walk daily , avoid bending and lifting. OK to shower. See Dr. Ophelia CharterYates in one week in office, call if you do not already have appointment

## 2018-03-28 ENCOUNTER — Ambulatory Visit (INDEPENDENT_AMBULATORY_CARE_PROVIDER_SITE_OTHER): Payer: BLUE CROSS/BLUE SHIELD | Admitting: Orthopaedic Surgery

## 2018-03-28 ENCOUNTER — Encounter (INDEPENDENT_AMBULATORY_CARE_PROVIDER_SITE_OTHER): Payer: Self-pay | Admitting: Orthopaedic Surgery

## 2018-03-28 VITALS — BP 111/70 | HR 85 | Ht 72.0 in | Wt 225.0 lb

## 2018-03-28 DIAGNOSIS — M5126 Other intervertebral disc displacement, lumbar region: Secondary | ICD-10-CM

## 2018-03-28 NOTE — Discharge Summary (Signed)
Patient ID: Douglas Gardner MRN: [id number] DOB/AGE: [birth year] 42 y.o.  Admit date: 03/20/2018 Discharge date: 03/28/2018  Admission Diagnoses:  Active Problems:   HNP (herniated nucleus pulposus), lumbar   Recurrent herniation of lumbar disc   Discharge Diagnoses:  Active Problems:   HNP (herniated nucleus pulposus), lumbar   Recurrent herniation of lumbar disc  status post Procedure(s): RIGHT REDO L5-S1  MICRODISCECTOMY FOR RECURRENT HERNIATED NUCLEUS PULPOSUS  Past Medical History:  Diagnosis Date  . HNP (herniated nucleus pulposus), lumbar   . Nerve root compression     Surgeries: Procedure(s): RIGHT REDO L5-S1  MICRODISCECTOMY FOR RECURRENT HERNIATED NUCLEUS PULPOSUS on 03/20/2018   Consultants:   Discharged Condition: Improved  Hospital Course: Douglas Gardner is an 42 y.o. male who was admitted 03/20/2018 for operative treatment of lumbar HNP. Patient failed conservative treatments (please see the history and physical for the specifics) and had severe unremitting pain that affects sleep, daily activities and work/hobbies. After pre-op clearance, the patient was taken to the operating room on 03/20/2018 and underwent  Procedure(s): RIGHT REDO L5-S1  MICRODISCECTOMY FOR RECURRENT HERNIATED NUCLEUS PULPOSUS.    Patient was given perioperative antibiotics:  Anti-infectives (From admission, onward)   Start     Dose/Rate Route Frequency Ordered Stop   03/21/18 0600  ceFAZolin (ANCEF) IVPB 2g/100 mL premix     2 g 200 mL/hr over 30 Minutes Intravenous On call to O.R. 03/20/18 1300 03/20/18 1530   03/20/18 2200  ceFAZolin (ANCEF) IVPB 2g/100 mL premix     2 g 200 mL/hr over 30 Minutes Intravenous Every 8 hours 03/20/18 1946 03/21/18 0553   03/20/18 1306  ceFAZolin (ANCEF) 2-4 GM/100ML-% IVPB    Note to Pharmacy:  Sandi Raveling   : cabinet override      03/20/18 1306 03/20/18 1530       Patient was given sequential compression devices and early ambulation to  prevent DVT.   Patient benefited maximally from hospital stay and there were no complications. At the time of discharge, the patient was urinating/moving their bowels without difficulty, tolerating a regular diet, pain is controlled with oral pain medications and they have been cleared by PT/OT.   Recent vital signs: No data found.   Recent laboratory studies: No results for input(s): WBC, HGB, HCT, PLT, NA, K, CL, CO2, BUN, CREATININE, GLUCOSE, INR, CALCIUM in the last 72 hours.  Invalid input(s): PT, 2   Discharge Medications:   Allergies as of 03/21/2018   No Known Allergies     Medication List    STOP taking these medications   cyclobenzaprine 10 MG tablet Commonly known as:  FLEXERIL   cyclobenzaprine 5 MG tablet Commonly known as:  FLEXERIL   HYDROcodone-acetaminophen 5-325 MG tablet Commonly known as:  NORCO/VICODIN   HYDROcodone-acetaminophen 7.5-325 MG tablet Commonly known as:  NORCO   ibuprofen 200 MG tablet Commonly known as:  ADVIL,MOTRIN   ibuprofen 800 MG tablet Commonly known as:  ADVIL,MOTRIN   ipratropium 0.06 % nasal spray Commonly known as:  ATROVENT   OVER THE COUNTER MEDICATION   polyethylene glycol packet Commonly known as:  MIRALAX / GLYCOLAX     TAKE these medications   doxycycline 100 MG capsule Commonly known as:  VIBRAMYCIN Take 1 capsule (100 mg total) by mouth 2 (two) times daily.   methocarbamol 500 MG tablet Commonly known as:  ROBAXIN Take 1 tablet (500 mg total) by mouth every 6 (six) hours as needed for muscle spasms.   oxyCODONE-acetaminophen  5-325 MG tablet Commonly known as:  PERCOCET/ROXICET Take 1-2 tablets by mouth every 4 (four) hours as needed for severe pain. What changed:    how much to take  reasons to take this       Diagnostic Studies: Dg Lumbar Spine 2-3 Views  Result Date: 03/20/2018 CLINICAL DATA:  Intraoperative localization EXAM: LUMBAR SPINE - 2-3 VIEW COMPARISON:  03/15/2018 FINDINGS: Three  lateral images of the lumbar spine were obtained intraoperatively. The initial image demonstrates a needle within the posterior soft tissues just below the L5-S1 interspace. Subsequent images demonstrate surgical instruments and retractors at the L5-S1 interspace. IMPRESSION: Intraoperative localization at L5-S1 Electronically Signed   By: Alcide Clever M.D.   On: 03/20/2018 16:23   Mr Lumbar Spine W Wo Contrast  Result Date: 03/15/2018 CLINICAL DATA:  Back pain and prior lumbar spine surgery EXAM: MRI LUMBAR SPINE WITHOUT AND WITH CONTRAST TECHNIQUE: Multiplanar and multiecho pulse sequences of the lumbar spine were obtained without and with intravenous contrast. CONTRAST:  20mL MULTIHANCE GADOBENATE DIMEGLUMINE 529 MG/ML IV SOLN COMPARISON:  Lumbar spine MRI 03/16/2017 FINDINGS: Segmentation:  Normal Alignment:  Normal Vertebrae: Hemangioma at the inferior anterior aspect of the L5 vertebral body is unchanged. No acute compression fracture. No discitis-osteomyelitis. Conus medullaris and cauda equina: Conus extends to the the L1 level. Conus and cauda equina appear normal. Paraspinal and other soft tissues: There is a 2.3 cm left renal cyst, unchanged. Disc levels: T11-12: Normal. T12-L1: Normal. L1-L2: Normal. L2-L3: Normal. L3-L4: Normal. L4-L5: There is a small central disc protrusion with annular fissure, unchanged in size. No spinal canal or neural foraminal stenosis. L5-S1: Remote right L5 hemilaminectomy. There is again seen right subarticular material that narrows the right lateral recess and displaces the descending right S1 nerve root. This material now shows more homogeneous contrast enhancement than on the previous study. There is a component of residual disc extrusion with inferior migration, nonenhancing, which also contributes to the mass effect on the right S1 nerve root. There is mild bilateral neural foraminal stenosis due to uncovertebral spurring. There is no central spinal canal stenosis.  IMPRESSION: 1. Persistence of right subarticular material at the L5-S1 level that narrows the right lateral recess and displaces the descending right S1 nerve root. The contrast enhancement pattern is now more homogeneous and this is most consistent with scar/granulation tissue. There is likely also a component of residual disc extrusion in the right lateral recess, corresponding to the nonenhancing component. 2. Unchanged L4-5 small central disc protrusion and annular fissure. Electronically Signed   By: Deatra Robinson M.D.   On: 03/15/2018 14:46   Dg Lumbar Spine 1 View  Result Date: 03/20/2018 CLINICAL DATA:  L5-S1 discectomy. EXAM: LUMBAR SPINE - 1 VIEW COMPARISON:  03/20/2018 FINDINGS: Examination demonstrates normal vertebral body alignment and heights. There is mild spondylosis present to include facet arthropathy. Minimal disc space narrowing at the L4-5 and L5-S1 levels. Surgical instrument is present with tip over the posterior elements at the L5-S1 level. IMPRESSION: Surgical instrument with tip over the posterior elements at the L5-S1 level. Electronically Signed   By: Elberta Fortis M.D.   On: 03/20/2018 17:03      Follow-up Information    Eldred Manges, MD Follow up in 1 week(s).   Specialty:  Orthopedic Surgery Contact information: [address] [place] [address] [phone]           Discharge Plan:  discharge to home  Disposition:     Signed:  Zonia Kief  03/28/2018, 10:34 AM

## 2018-03-28 NOTE — Progress Notes (Signed)
   Post-Op Visit Note   Patient: Douglas Gardner           Date of Birth: 1976-05-12           MRN: [id number] Visit Date: 03/28/2018 PCP: Patient, No Pcp Per   Assessment & Plan: Postop recurrent microdiscectomy L5-S1 right.  He is on ibuprofen occasional Flexeril.  Incision looks good staples are harvested Steri-Strips applied.  Chief Complaint:  Chief Complaint  Patient presents with  . Lower Back - Routine Post Op    03/20/18 Right Redo L5-S1 microdiscectomy for recurrent HNP   Visit Diagnoses:  1. Recurrent herniation of lumbar disc     Plan: Recheck 4 weeks.  Work slip given no work x5 weeks.  Follow-Up Instructions: Return in about 4 weeks (around 04/25/2018).   Orders:  No orders of the defined types were placed in this encounter.  No orders of the defined types were placed in this encounter.   Imaging: No results found.  PMFS History: Patient Active Problem List   Diagnosis Date Noted  . Recurrent herniation of lumbar disc 08/18/2012  . HNP (herniated nucleus pulposus), lumbar 08/17/2012    Class: Diagnosis of   Past Medical History:  Diagnosis Date  . HNP (herniated nucleus pulposus), lumbar   . Nerve root compression     No family history on file.  Past Surgical History:  Procedure Laterality Date  . LUMBAR LAMINECTOMY N/A 08/18/2012   Procedure: Right L5-S1 MICRODISCECTOMY, Removal free fragment;  Surgeon: Eldred Manges, MD;  Location: [facility] OR;  Service: Orthopedics;  Laterality: N/A;  . LUMBAR LAMINECTOMY/DECOMPRESSION MICRODISCECTOMY N/A 03/20/2018   Procedure: RIGHT REDO L5-S1  MICRODISCECTOMY FOR RECURRENT HERNIATED NUCLEUS PULPOSUS;  Surgeon: Eldred Manges, MD;  Location: [facility] OR;  Service: Orthopedics;  Laterality: N/A;  . MICRODISCECTOMY LUMBAR  2/07/29/2012  . VASECTOMY    . WISDOM TOOTH EXTRACTION     Social History   Occupational History  . Not on file  Tobacco Use  . Smoking status: Never Smoker  . Smokeless tobacco: Never Used  Substance  and Sexual Activity  . Alcohol use: Yes    Alcohol/week: 14.0 standard drinks    Types: 14 Glasses of wine per week    Comment: 2 glasses of wine and/or beer daily   . Drug use: No  . Sexual activity: Not on file

## 2018-04-25 ENCOUNTER — Ambulatory Visit (INDEPENDENT_AMBULATORY_CARE_PROVIDER_SITE_OTHER): Payer: BLUE CROSS/BLUE SHIELD | Admitting: Orthopaedic Surgery

## 2018-04-25 ENCOUNTER — Encounter (INDEPENDENT_AMBULATORY_CARE_PROVIDER_SITE_OTHER): Payer: Self-pay | Admitting: Orthopaedic Surgery

## 2018-04-25 VITALS — BP 134/84 | HR 77 | Ht 72.0 in | Wt 225.0 lb

## 2018-04-25 DIAGNOSIS — M5126 Other intervertebral disc displacement, lumbar region: Secondary | ICD-10-CM

## 2018-04-25 NOTE — Progress Notes (Signed)
   Post-Op Visit Note   Patient: Douglas Gardner           Date of Birth: 1976-02-08           MRN: [id number] Visit Date: 04/25/2018 PCP: Patient, No Pcp Per   Assessment & Plan: Post microdiscectomy right L5-S1 for recurrent HNP on 03/20/2018.  Previous surgery was 5 years ago in February 2014.  He is got some improved sensation to his foot he still has some calf weakness and atrophy.  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  Work resumption on 05/08/2018.  He will call if he has problems.  Chief Complaint: Post redo microdiscectomy 03/20/2018 with good relief of leg pain. Visit Diagnoses:  1. Recurrent herniation of lumbar disc     Plan: Resume work 05/08/2018 work slip given.  He is happy with the surgical result office follow-up PRN.  Follow-Up Instructions: No follow-ups on file.   Orders:  No orders of the defined types were placed in this encounter.  No orders of the defined types were placed in this encounter.   Imaging: No results found.  PMFS History: Patient Active Problem List   Diagnosis Date Noted  . Recurrent herniation of lumbar disc 08/18/2012  . HNP (herniated nucleus pulposus), lumbar 08/17/2012    Class: Diagnosis of   Past Medical History:  Diagnosis Date  . HNP (herniated nucleus pulposus), lumbar   . Nerve root compression     No family history on file.  Past Surgical History:  Procedure Laterality Date  . LUMBAR LAMINECTOMY N/A 08/18/2012   Procedure: Right L5-S1 MICRODISCECTOMY, Removal free fragment;  Surgeon: Eldred Manges, MD;  Location: [facility] OR;  Service: Orthopedics;  Laterality: N/A;  . LUMBAR LAMINECTOMY/DECOMPRESSION MICRODISCECTOMY N/A 03/20/2018   Procedure: RIGHT REDO L5-S1  MICRODISCECTOMY FOR RECURRENT HERNIATED NUCLEUS PULPOSUS;  Surgeon: Eldred Manges, MD;  Location: [facility] OR;  Service: Orthopedics;  Laterality: N/A;  . MICRODISCECTOMY LUMBAR  2/07/29/2012  . VASECTOMY    . WISDOM TOOTH EXTRACTION     Social History    Occupational History  . Not on file  Tobacco Use  . Smoking status: Never Smoker  . Smokeless tobacco: Never Used  Substance and Sexual Activity  . Alcohol use: Yes    Alcohol/week: 14.0 standard drinks    Types: 14 Glasses of wine per week    Comment: 2 glasses of wine and/or beer daily   . Drug use: No  . Sexual activity: Not on file

## 2018-04-26 ENCOUNTER — Telehealth (INDEPENDENT_AMBULATORY_CARE_PROVIDER_SITE_OTHER): Payer: Self-pay | Admitting: Radiology

## 2018-04-26 NOTE — Telephone Encounter (Signed)
Patient came in to office asking for last office note to be faxed to [facility] at [phone]. He completed release of information paperwork and note was faxed.

## 2018-05-22 ENCOUNTER — Telehealth (INDEPENDENT_AMBULATORY_CARE_PROVIDER_SITE_OTHER): Payer: Self-pay | Admitting: Orthopaedic Surgery

## 2018-05-22 NOTE — Telephone Encounter (Signed)
I called discussed.  

## 2018-05-22 NOTE — Telephone Encounter (Signed)
Please see below.

## 2018-05-22 NOTE — Telephone Encounter (Signed)
Patient called and would like to talk with Dr. Ophelia CharterYates in reference to his back.  CB#[phone].  Thank you.

## 2018-05-23 ENCOUNTER — Telehealth (INDEPENDENT_AMBULATORY_CARE_PROVIDER_SITE_OTHER): Payer: Self-pay | Admitting: Orthopaedic Surgery

## 2018-05-23 DIAGNOSIS — M545 Low back pain, unspecified: Secondary | ICD-10-CM

## 2018-05-23 MED ORDER — PREDNISONE 10 MG (21) PO TBPK: ORAL_TABLET | ORAL | 0 refills | Status: AC

## 2018-05-23 NOTE — Telephone Encounter (Signed)
Patient called and stated Dr Ophelia CharterYates told him to call office. Patient can't walk this am and pressure on right buttocks.  Please call patient to advise.  [phone]

## 2018-05-23 NOTE — Telephone Encounter (Signed)
I spoke with patient. Per Dr. Ophelia CharterYates, order STAT MRI Lumbar with and without. Also send in Prednisone pack.  Orders entered.

## 2018-05-24 ENCOUNTER — Ambulatory Visit (HOSPITAL_COMMUNITY)
Admission: RE | Admit: 2018-05-24 | Discharge: 2018-05-24 | Disposition: A | Discharge status: Home / Self Care | Payer: BLUE CROSS/BLUE SHIELD | Source: Ambulatory Visit | Attending: Orthopaedic Surgery | Admitting: Orthopaedic Surgery

## 2018-05-24 DIAGNOSIS — R937 Abnormal findings on diagnostic imaging of other parts of musculoskeletal system: Secondary | ICD-10-CM | POA: Insufficient documentation

## 2018-05-24 DIAGNOSIS — M545 Low back pain, unspecified: Secondary | ICD-10-CM

## 2018-05-24 DIAGNOSIS — M48061 Spinal stenosis, lumbar region without neurogenic claudication: Secondary | ICD-10-CM | POA: Diagnosis not present

## 2018-05-24 DIAGNOSIS — M2578 Osteophyte, vertebrae: Secondary | ICD-10-CM | POA: Diagnosis not present

## 2018-05-24 MED ORDER — GADOBUTROL 1 MMOL/ML IV SOLN
10.0000 mL | Freq: Once | INTRAVENOUS | Status: AC | PRN
  Administered 2018-05-24: 10 mL via INTRAVENOUS

## 2018-05-30 ENCOUNTER — Telehealth (INDEPENDENT_AMBULATORY_CARE_PROVIDER_SITE_OTHER): Payer: Self-pay | Admitting: Orthopaedic Surgery

## 2018-05-30 NOTE — Telephone Encounter (Signed)
Please advise 

## 2018-05-30 NOTE — Telephone Encounter (Signed)
I CALLED DISCUSSED.  

## 2018-05-30 NOTE — Telephone Encounter (Signed)
Patient called advised he had his MRI 05/24/18 and would like a call with the results. Patient said this was a stat MRI. The number to contact patient is [phone]

## 2019-01-25 IMAGING — CR DG LUMBAR SPINE 2-3V
3 series · 3 of 3 positions shown · non-contrast
Comparison: 03/15/2018

CLINICAL DATA: Intraoperative localization

EXAM:
LUMBAR SPINE - 2-3 VIEW

[xtable lateral (1 of 3)]
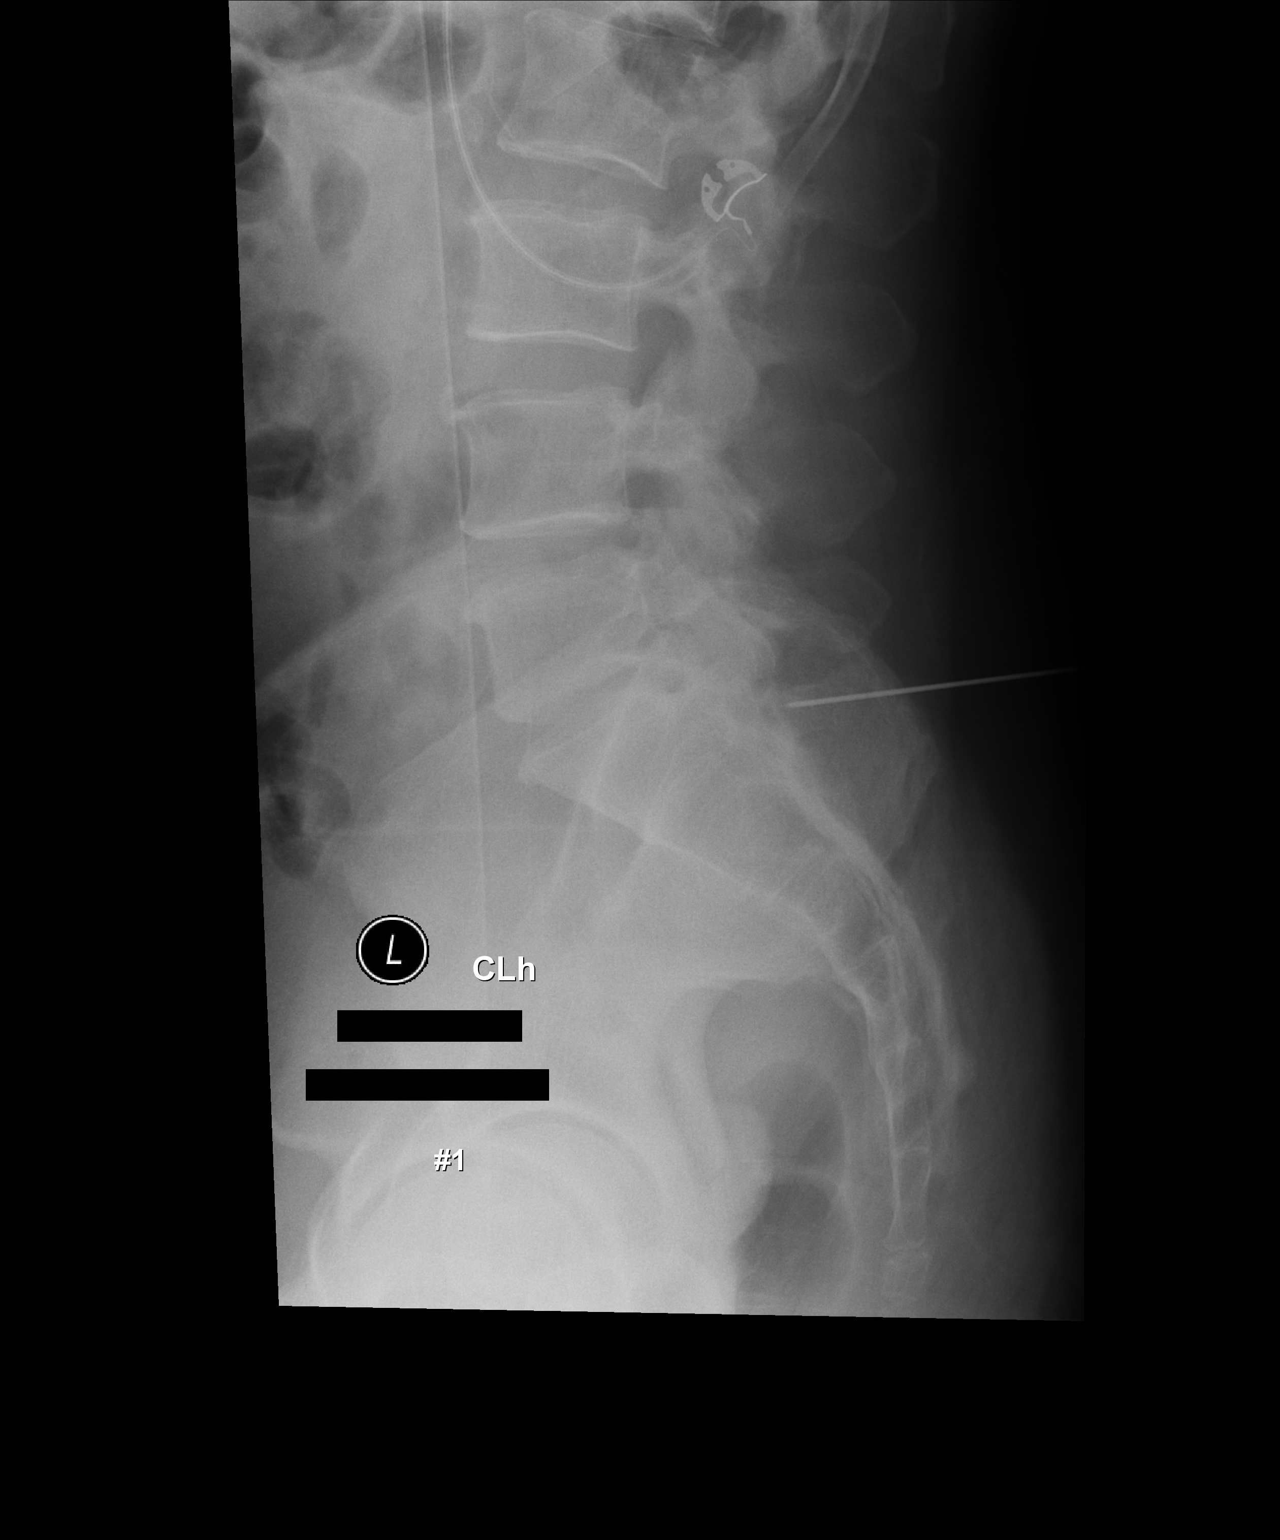

[xtable lateral (2 of 3)]
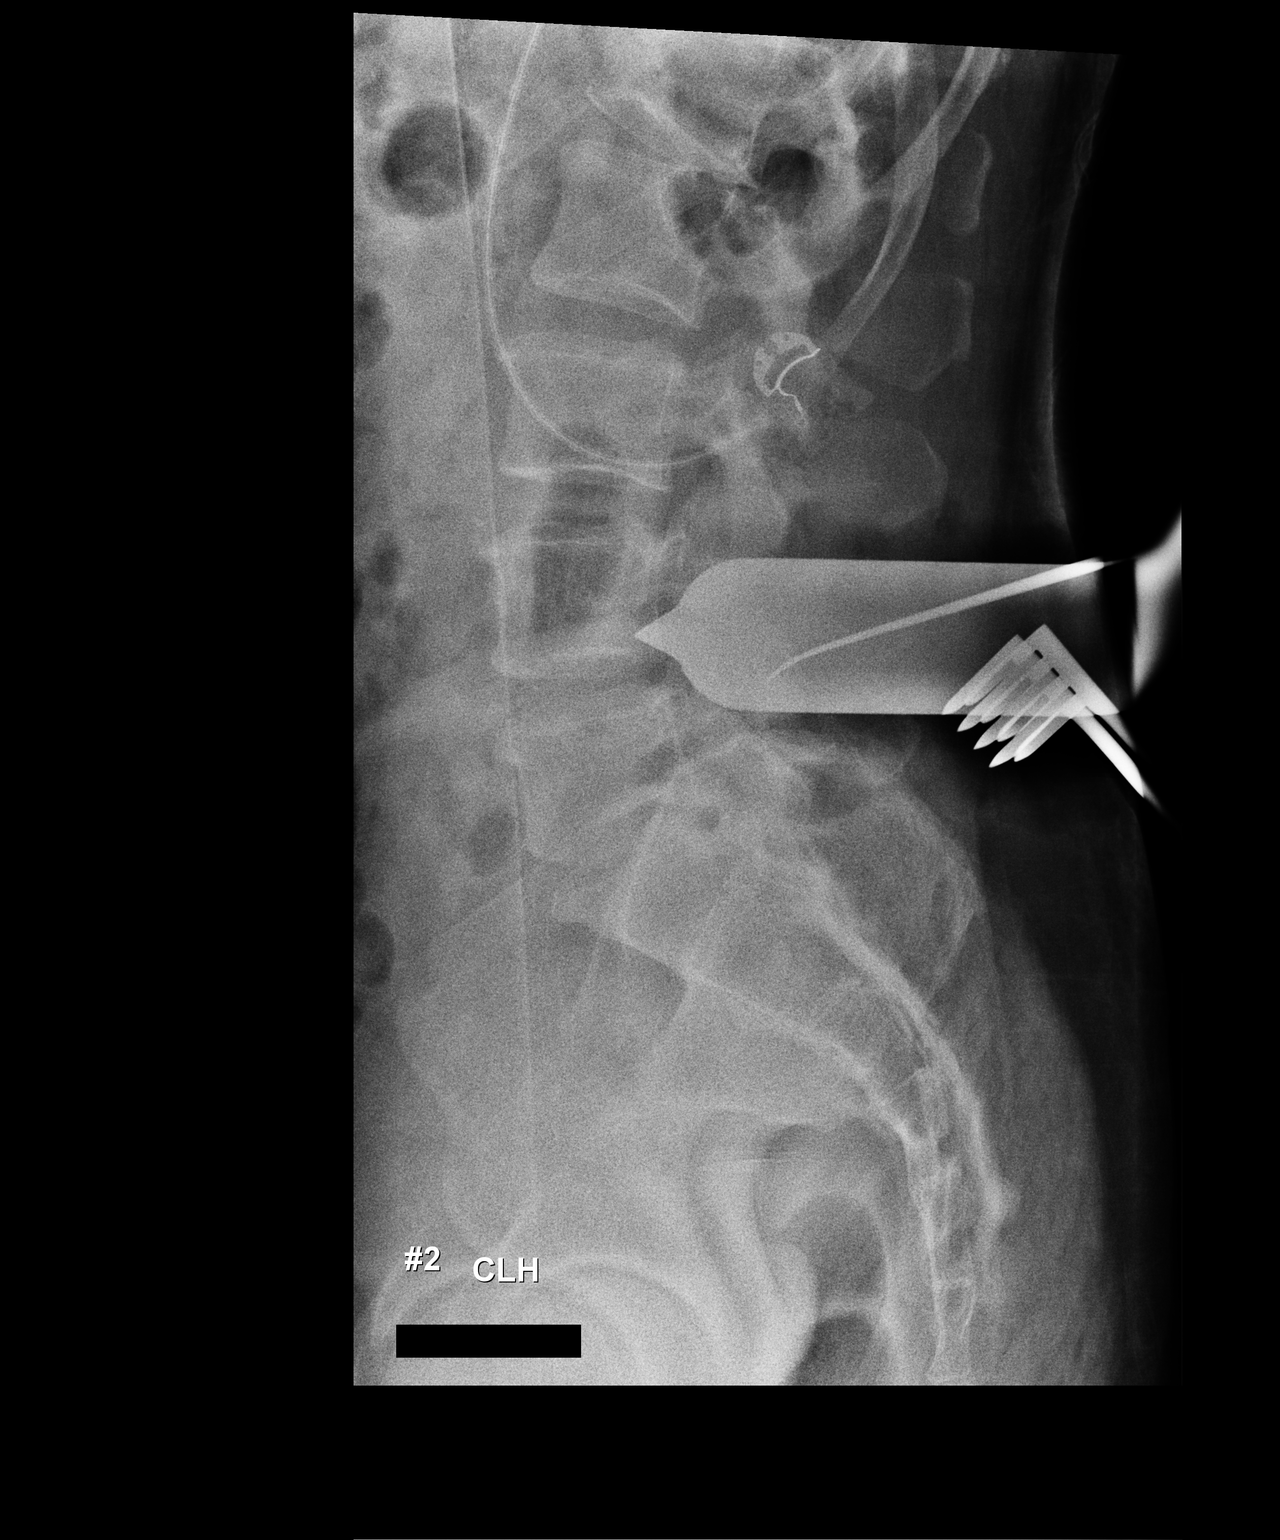

[xtable lateral (3 of 3)]
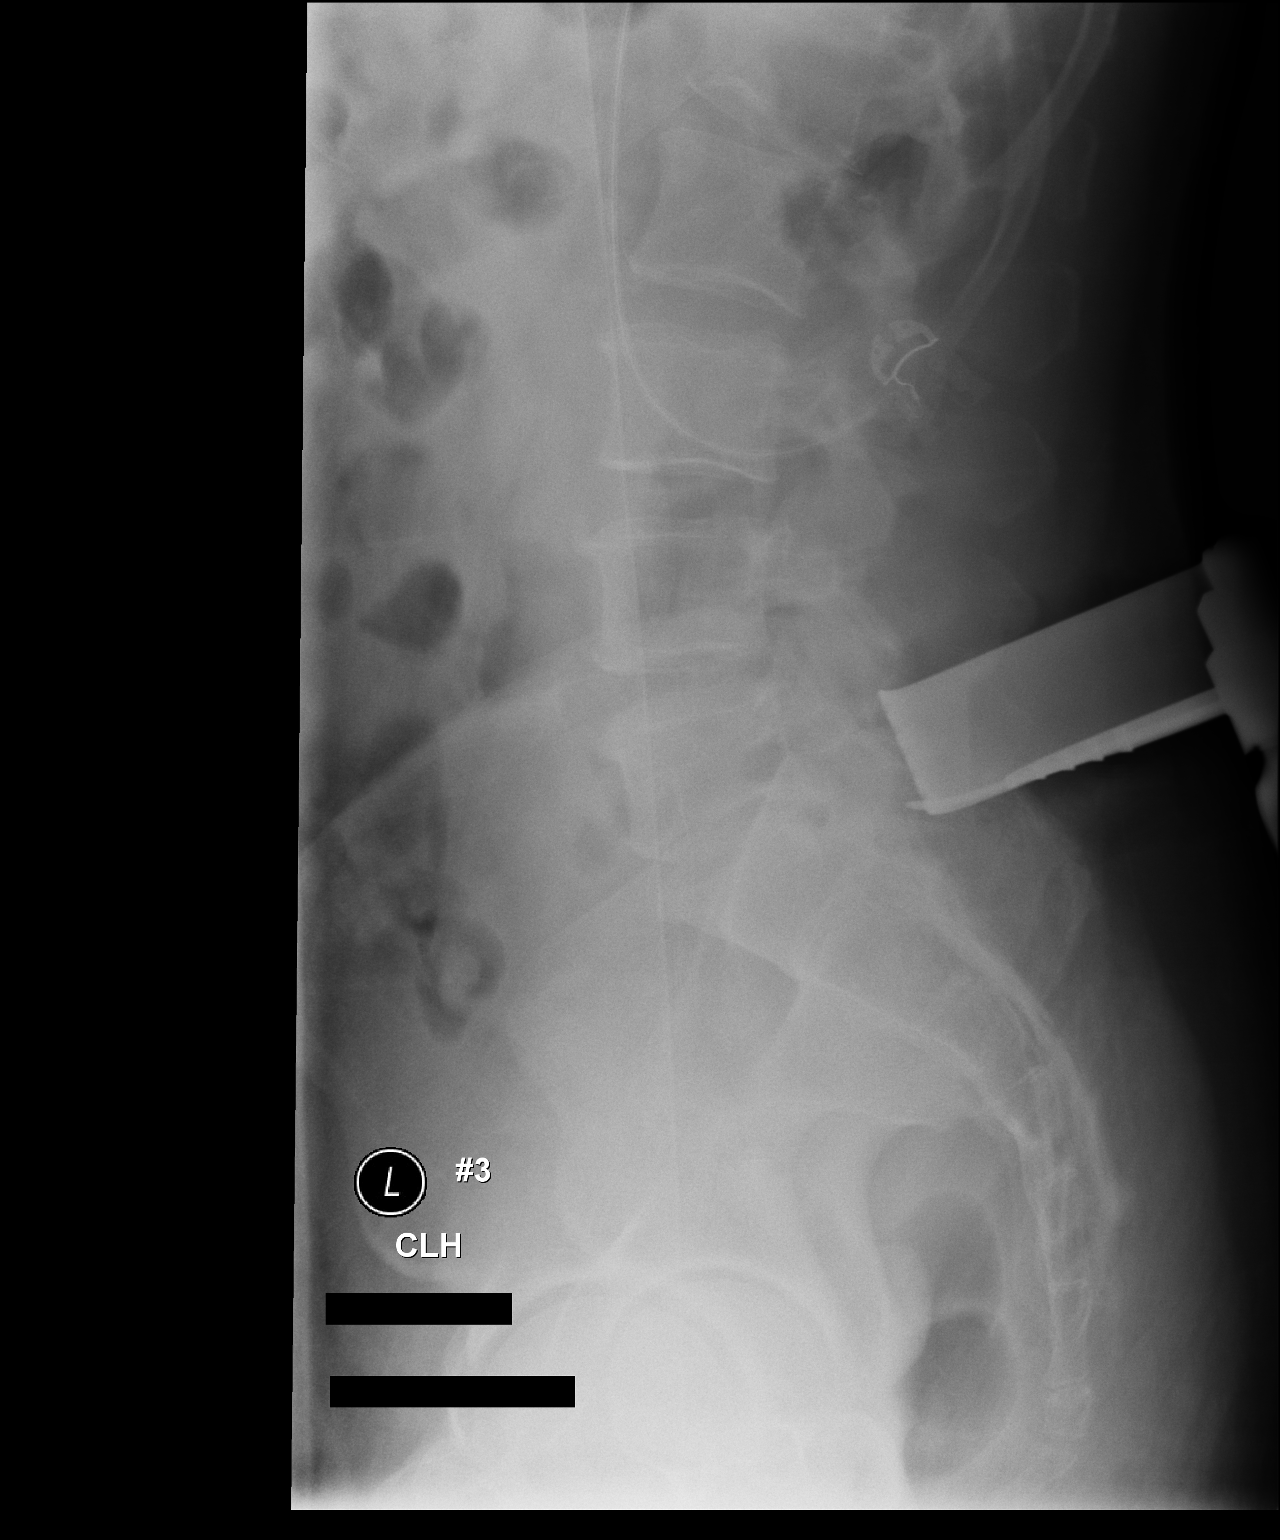

[3 of 3 positions shown; findings below may reference images not displayed]

FINDINGS: Three lateral images of the lumbar spine were obtained
intraoperatively. The initial image demonstrates a needle within the
posterior soft tissues just below the L5-S1 interspace. Subsequent
images demonstrate surgical instruments and retractors at the L5-S1
interspace.
IMPRESSION: Intraoperative localization at L5-S1

## 2019-04-16 ENCOUNTER — Other Ambulatory Visit: Payer: Self-pay

## 2019-04-16 DIAGNOSIS — Z20822 Contact with and (suspected) exposure to covid-19: Secondary | ICD-10-CM

## 2019-04-18 LAB — NOVEL CORONAVIRUS, NAA: SARS-CoV-2, NAA: NOT DETECTED

## 2019-07-30 ENCOUNTER — Other Ambulatory Visit: Payer: BLUE CROSS/BLUE SHIELD
